# Patient Record
Sex: Female | Born: 1970 | Race: Asian | Hispanic: No | Marital: Married | Smoking: Never smoker
Health system: Southern US, Community
[De-identification: ages and names within clinical notes are randomized; demographics above are authoritative.]

## PROBLEM LIST (undated history)

## (undated) DIAGNOSIS — Z789 Other specified health status: Secondary | ICD-10-CM

## (undated) HISTORY — DX: Other specified health status: Z78.9

## (undated) HISTORY — PX: NO PAST SURGERIES: SHX2092

---

## 2010-06-13 ENCOUNTER — Ambulatory Visit: Payer: Self-pay | Admitting: Internal Medicine

## 2010-12-24 ENCOUNTER — Ambulatory Visit: Payer: Self-pay | Admitting: Internal Medicine

## 2011-06-05 ENCOUNTER — Ambulatory Visit: Payer: Self-pay | Admitting: Internal Medicine

## 2011-11-05 ENCOUNTER — Ambulatory Visit: Payer: Self-pay | Admitting: General Practice

## 2012-06-15 ENCOUNTER — Ambulatory Visit: Payer: Self-pay | Admitting: Internal Medicine

## 2015-02-15 ENCOUNTER — Other Ambulatory Visit: Payer: Self-pay | Admitting: Internal Medicine

## 2015-02-15 DIAGNOSIS — Z1231 Encounter for screening mammogram for malignant neoplasm of breast: Secondary | ICD-10-CM

## 2015-02-22 ENCOUNTER — Ambulatory Visit: Payer: Self-pay | Attending: Internal Medicine

## 2015-11-24 ENCOUNTER — Other Ambulatory Visit: Payer: Self-pay | Admitting: Internal Medicine

## 2015-11-24 DIAGNOSIS — M25561 Pain in right knee: Secondary | ICD-10-CM

## 2016-08-27 ENCOUNTER — Other Ambulatory Visit: Payer: Self-pay | Admitting: Internal Medicine

## 2016-08-27 DIAGNOSIS — Z1231 Encounter for screening mammogram for malignant neoplasm of breast: Secondary | ICD-10-CM

## 2016-08-29 ENCOUNTER — Ambulatory Visit
Admission: RE | Admit: 2016-08-29 | Discharge: 2016-08-29 | Disposition: A | Discharge status: Home / Self Care | Payer: 59 | Source: Ambulatory Visit | Attending: Internal Medicine | Admitting: Internal Medicine

## 2016-08-29 ENCOUNTER — Encounter: Payer: Self-pay | Admitting: Radiology

## 2016-08-29 DIAGNOSIS — Z1231 Encounter for screening mammogram for malignant neoplasm of breast: Secondary | ICD-10-CM | POA: Insufficient documentation

## 2018-02-16 ENCOUNTER — Ambulatory Visit (INDEPENDENT_AMBULATORY_CARE_PROVIDER_SITE_OTHER): Payer: Managed Care, Other (non HMO) | Admitting: Obstetrics and Gynecology

## 2018-02-16 ENCOUNTER — Encounter: Payer: Self-pay | Admitting: Obstetrics and Gynecology

## 2018-02-16 VITALS — BP 102/68 | HR 68 | Ht 63.0 in | Wt 155.0 lb

## 2018-02-16 DIAGNOSIS — Z1239 Encounter for other screening for malignant neoplasm of breast: Secondary | ICD-10-CM

## 2018-02-16 DIAGNOSIS — Z1231 Encounter for screening mammogram for malignant neoplasm of breast: Secondary | ICD-10-CM | POA: Diagnosis not present

## 2018-02-16 DIAGNOSIS — Z01419 Encounter for gynecological examination (general) (routine) without abnormal findings: Secondary | ICD-10-CM

## 2018-02-16 DIAGNOSIS — Z124 Encounter for screening for malignant neoplasm of cervix: Secondary | ICD-10-CM | POA: Diagnosis not present

## 2018-02-16 NOTE — Progress Notes (Signed)
Gynecology Annual Exam  PCP: Patient, No Pcp Per  Chief Complaint:  Chief Complaint  Patient presents with  . Gynecologic Exam    History of Present Illness: Patient is a 47 y.o. No obstetric history on file. presents for annual exam. The patient has no complaints today.   LMP: Patient's last menstrual period was 01/09/2018. No menses IUD   The patient is sexually active. She currently uses IUD for contraception. She denies dyspareunia.  The patient does perform self breast exams.  There is no notable family history of breast or ovarian cancer in her family.  The patient wears seatbelts: yes.   The patient has regular exercise: not asked.    The patient denies current symptoms of depression.    Review of Systems: Review of Systems  Constitutional: Negative for chills and fever.  HENT: Negative for congestion.   Respiratory: Negative for cough and shortness of breath.   Cardiovascular: Negative for chest pain and palpitations.  Gastrointestinal: Negative for abdominal pain, constipation, diarrhea, heartburn, nausea and vomiting.  Genitourinary: Negative for dysuria, frequency and urgency.  Skin: Negative for itching and rash.  Neurological: Negative for dizziness and headaches.  Endo/Heme/Allergies: Negative for polydipsia.  Psychiatric/Behavioral: Negative for depression.    Past Medical History:  History reviewed. No pertinent past medical history.  Past Surgical History:  History reviewed. No pertinent surgical history.  Gynecologic History:  Patient's last menstrual period was 01/09/2018. Contraception: 06/02/2014 IUD Mirena Last Pap: Results were: 08/15/2016 NIL and HR HPV negative  Last mammogram: 08/29/2016 Results were: BI-RAD I  Obstetric History: No obstetric history on file.  Family History:  Family History  Problem Relation Age of Onset  . Breast cancer Maternal Grandmother 80    Social History:  Social History   Socioeconomic History  .  Marital status: Married    Spouse name: Not on file  . Number of children: Not on file  . Years of education: Not on file  . Highest education level: Not on file  Occupational History  . Not on file  Social Needs  . Financial resource strain: Not on file  . Food insecurity:    Worry: Not on file    Inability: Not on file  . Transportation needs:    Medical: Not on file    Non-medical: Not on file  Tobacco Use  . Smoking status: Never Smoker  . Smokeless tobacco: Never Used  Substance and Sexual Activity  . Alcohol use: Never    Frequency: Never  . Drug use: Never  . Sexual activity: Yes    Partners: Male    Birth control/protection: IUD  Lifestyle  . Physical activity:    Days per week: Not on file    Minutes per session: Not on file  . Stress: Not on file  Relationships  . Social connections:    Talks on phone: Not on file    Gets together: Not on file    Attends religious service: Not on file    Active member of club or organization: Not on file    Attends meetings of clubs or organizations: Not on file    Relationship status: Not on file  . Intimate partner violence:    Fear of current or ex partner: Not on file    Emotionally abused: Not on file    Physically abused: Not on file    Forced sexual activity: Not on file  Other Topics Concern  . Not on file  Social History  Narrative  . Not on file    Allergies:  No Known Allergies  Medications: Prior to Admission medications   Not on File    Physical Exam Vitals: Blood pressure 102/68, pulse 68, height 5\' 3"  (1.6 m), weight 155 lb (70.3 kg), last menstrual period 01/09/2018.  General: NAD HEENT: normocephalic, anicteric Thyroid: no enlargement, no palpable nodules Pulmonary: No increased work of breathing, CTAB Cardiovascular: RRR, distal pulses 2+ Breast: Breast symmetrical, no tenderness, no palpable nodules or masses, no skin or nipple retraction present, no nipple discharge.  No axillary or  supraclavicular lymphadenopathy. Abdomen: NABS, soft, non-tender, non-distended.  Umbilicus without lesions.  No hepatomegaly, splenomegaly or masses palpable. No evidence of hernia  Genitourinary:  External: Normal external female genitalia.  Normal urethral meatus, normal Bartholin's and Skene's glands.    Vagina: Normal vaginal mucosa, no evidence of prolapse.    Cervix: Grossly normal in appearance, no bleeding, IUD string visualized 2cm  Uterus: Non-enlarged, mobile, normal contour.  No CMT  Adnexa: ovaries non-enlarged, no adnexal masses  Rectal: deferred  Lymphatic: no evidence of inguinal lymphadenopathy Extremities: no edema, erythema, or tenderness Neurologic: Grossly intact Psychiatric: mood appropriate, affect full  Female chaperone present for pelvic and breast  portions of the physical exam    Assessment: 47 y.o. No obstetric history on file. routine annual exam  Plan: Problem List Items Addressed This Visit    None    Visit Diagnoses    Breast screening    -  Primary   Relevant Orders   MM DIGITAL SCREENING BILATERAL   Screening for malignant neoplasm of cervix       Relevant Orders   PapIG, HPV, rfx 16/18   Encounter for gynecological examination without abnormal finding       Relevant Orders   PapIG, HPV, rfx 16/18      1) Mammogram - recommend yearly screening mammogram.  Mammogram Was ordered today   2) STI screening  was notoffered and therefore not obtained  3) ASCCP guidelines and rational discussed.  Patient opts for yearly screening interval  4) Contraception - the patient is currently using  IUD.  She is happy with her current form of contraception and plans to continue  5) Colonoscopy -- Screening recommended starting at age 45 for average risk individuals, age 45 for individuals deemed at increased risk (including African Americans) and recommended to continue until age 45.  For patient age 45-85 individualized approach is recommended.  Gold  standard screening is via colonoscopy, Cologuard screening is an acceptable alternative for patient unwilling or unable to undergo colonoscopy.  "Colorectal cancer screening for average?risk adults: 2018 guideline update from the American Cancer Society"CA: A Cancer Journal for Clinicians: Jan 29, 2017   6) Routine healthcare maintenance including cholesterol, diabetes screening discussed managed by PCP  7) No follow-ups on file.   Vena AustriaAndreas Tinisha Etzkorn, MD, Merlinda FrederickFACOG [facility], [facility] 02/16/2018, 8:58 AM

## 2018-02-16 NOTE — Patient Instructions (Signed)
[facility] [place] [address]  [facility]  [address] [place] [address]  Phone: [phone]   Preventive Care 18-39 Years, Female Preventive care refers to lifestyle choices and visits with your health care provider that can promote health and wellness. What does preventive care include?  A yearly physical exam. This is also called an annual well check.  Dental exams once or twice a year.  Routine eye exams. Ask your health care provider how often you should have your eyes checked.  Personal lifestyle choices, including: ? Daily care of your teeth and gums. ? Regular physical activity. ? Eating a healthy diet. ? Avoiding tobacco and drug use. ? Limiting alcohol use. ? Practicing safe sex. ? Taking vitamin and mineral supplements as recommended by your health care provider. What happens during an annual well check? The services and screenings done by your health care provider during your annual well check will depend on your age, overall health, lifestyle risk factors, and family history of disease. Counseling Your health care provider may ask you questions about your:  Alcohol use.  Tobacco use.  Drug use.  Emotional well-being.  Home and relationship well-being.  Sexual activity.  Eating habits.  Work and work Statistician.  Method of birth control.  Menstrual cycle.  Pregnancy history.  Screening You may have the following tests or measurements:  Height, weight, and BMI.  Diabetes screening. This is done by checking your blood sugar (glucose) after you have not eaten for a while (fasting).  Blood pressure.  Lipid and cholesterol levels. These may be checked every 5 years starting at age 85.  Skin check.  Hepatitis C blood test.  Hepatitis B blood test.  Sexually transmitted disease (STD) testing.  BRCA-related cancer screening. This may be done if you have a family history of breast,  ovarian, tubal, or peritoneal cancers.  Pelvic exam and Pap test. This may be done every 3 years starting at age 61. Starting at age 58, this may be done every 5 years if you have a Pap test in combination with an HPV test.  Discuss your test results, treatment options, and if necessary, the need for more tests with your health care provider. Vaccines Your health care provider may recommend certain vaccines, such as:  Influenza vaccine. This is recommended every year.  Tetanus, diphtheria, and acellular pertussis (Tdap, Td) vaccine. You may need a Td booster every 10 years.  Varicella vaccine. You may need this if you have not been vaccinated.  HPV vaccine. If you are 46 or younger, you may need three doses over 6 months.  Measles, mumps, and rubella (MMR) vaccine. You may need at least one dose of MMR. You may also need a second dose.  Pneumococcal 13-valent conjugate (PCV13) vaccine. You may need this if you have certain conditions and were not previously vaccinated.  Pneumococcal polysaccharide (PPSV23) vaccine. You may need one or two doses if you smoke cigarettes or if you have certain conditions.  Meningococcal vaccine. One dose is recommended if you are age 58-21 years and a first-year college student living in a residence hall, or if you have one of several medical conditions. You may also need additional booster doses.  Hepatitis A vaccine. You may need this if you have certain conditions or if you travel or work in places where you may be exposed to hepatitis A.  Hepatitis B vaccine. You may need this if you have certain conditions or  if you travel or work in places where you may be exposed to hepatitis B.  Haemophilus influenzae type b (Hib) vaccine. You may need this if you have certain risk factors.  Talk to your health care provider about which screenings and vaccines you need and how often you need them. This information is not intended to replace advice given to you by  your health care provider. Make sure you discuss any questions you have with your health care provider. Document Released: 10/15/2001 Document Revised: 05/08/2016 Document Reviewed: 06/20/2015 Elsevier Interactive Patient Education  [employer].

## 2018-02-19 LAB — PAPIG, HPV, RFX 16/18
HPV, high-risk: NEGATIVE
PAP Smear Comment: 0

## 2019-07-05 ENCOUNTER — Telehealth: Payer: Self-pay | Admitting: Obstetrics and Gynecology

## 2019-07-05 NOTE — Telephone Encounter (Signed)
Noted. Mirena reserved for this patient. 

## 2019-07-05 NOTE — Telephone Encounter (Signed)
Patient is schedule for annual and Mirena replacement on 07/19/19 with AMS

## 2019-07-19 ENCOUNTER — Encounter: Payer: Self-pay | Admitting: Obstetrics and Gynecology

## 2019-07-19 ENCOUNTER — Other Ambulatory Visit: Payer: Self-pay

## 2019-07-19 ENCOUNTER — Ambulatory Visit (INDEPENDENT_AMBULATORY_CARE_PROVIDER_SITE_OTHER): Payer: Managed Care, Other (non HMO) | Admitting: Obstetrics and Gynecology

## 2019-07-19 VITALS — BP 136/88 | Ht 62.0 in | Wt 176.0 lb

## 2019-07-19 DIAGNOSIS — Z1239 Encounter for other screening for malignant neoplasm of breast: Secondary | ICD-10-CM

## 2019-07-19 DIAGNOSIS — Z01419 Encounter for gynecological examination (general) (routine) without abnormal findings: Secondary | ICD-10-CM

## 2019-07-19 DIAGNOSIS — Z124 Encounter for screening for malignant neoplasm of cervix: Secondary | ICD-10-CM

## 2019-07-19 DIAGNOSIS — Z30431 Encounter for routine checking of intrauterine contraceptive device: Secondary | ICD-10-CM

## 2019-07-19 NOTE — Patient Instructions (Signed)
[facility] [address] [place] [address]  [facility]  [address] [place] [address]  Phone: [phone]  

## 2019-07-19 NOTE — Progress Notes (Signed)
Gynecology Annual Exam  PCP: Shelley Harper, No Pcp Per  Chief Complaint:  Chief Complaint  Shelley Harper presents with  . Procedure    IUD removal/Re-insert    History of Present Illness: Shelley Harper is a 48 y.o. G4P0020 presents for annual exam. The Shelley Harper has no complaints today.   LMP: No LMP recorded. (Menstrual status: IUD). Amenorrhea on Mirena IUD  The Shelley Harper is sexually active. She currently uses IUD for contraception. She denies dyspareunia.  The Shelley Harper does perform self breast exams.  There is no notable family history of breast or ovarian cancer in her family, only case of breast cancer paternal grandmother.  The Shelley Harper wears seatbelts: yes.   The Shelley Harper has regular exercise: not asked.    The Shelley Harper denies current symptoms of depression.    Review of Systems: Review of Systems  Constitutional: Negative for chills and fever.  HENT: Negative for congestion.   Respiratory: Negative for cough and shortness of breath.   Cardiovascular: Negative for chest pain and palpitations.  Gastrointestinal: Negative for abdominal pain, constipation, diarrhea, heartburn, nausea and vomiting.  Genitourinary: Negative for dysuria, frequency and urgency.  Skin: Negative for itching and rash.  Neurological: Negative for dizziness and headaches.  Endo/Heme/Allergies: Negative for polydipsia.  Psychiatric/Behavioral: Negative for depression.    Past Medical History:  Past Medical History:  Diagnosis Date  . No known health problems     Past Surgical History:  Past Surgical History:  Procedure Laterality Date  . NO PAST SURGERIES      Gynecologic History:  No LMP recorded. (Menstrual status: IUD). Contraception:06/02/2014 Mirena IUD Last Pap: Results were: 02/16/2018 NIL and HR HPV negative  08/15/2016 NIL and HR HPV negative  Last mammogram: 08/29/2016 Results were: BI-RAD I  Obstetric History: G4P0020  Family History:  Family History  Problem Relation Age of Onset  . Breast  cancer Maternal Grandmother 74    Social History:  Social History   Socioeconomic History  . Marital status: Married    Spouse name: Not on file  . Number of children: Not on file  . Years of education: Not on file  . Highest education level: Not on file  Occupational History  . Not on file  Social Needs  . Financial resource strain: Not on file  . Food insecurity    Worry: Not on file    Inability: Not on file  . Transportation needs    Medical: Not on file    Non-medical: Not on file  Tobacco Use  . Smoking status: Never Smoker  . Smokeless tobacco: Never Used  Substance and Sexual Activity  . Alcohol use: Never    Frequency: Never  . Drug use: Never  . Sexual activity: Yes    Partners: Male    Birth control/protection: I.U.D.  Lifestyle  . Physical activity    Days per week: Not on file    Minutes per session: Not on file  . Stress: Not on file  Relationships  . Social Herbalist on phone: Not on file    Gets together: Not on file    Attends religious service: Not on file    Active member of club or organization: Not on file    Attends meetings of clubs or organizations: Not on file    Relationship status: Not on file  . Intimate partner violence    Fear of current or ex partner: Not on file    Emotionally abused: Not on file  Physically abused: Not on file    Forced sexual activity: Not on file  Other Topics Concern  . Not on file  Social History Narrative  . Not on file    Allergies:  No Known Allergies  Medications: Prior to Admission medications   Medication Sig Start Date End Date Taking? Authorizing Provider  Cholecalciferol (VITAMIN D3) 1000 units CAPS Take by mouth. 07/23/16   [provider]    Physical Exam Vitals: Blood pressure 136/88, height 5\' 2"  (1.575 m), weight 176 lb (79.8 kg).  General: NAD HEENT: normocephalic, anicteric Thyroid: no enlargement, no palpable nodules Pulmonary: No increased work of  breathing, CTAB Cardiovascular: RRR, distal pulses 2+ Breast: Breast symmetrical, no tenderness, no palpable nodules or masses, no skin or nipple retraction present, no nipple discharge.  No axillary or supraclavicular lymphadenopathy. Abdomen: NABS, soft, non-tender, non-distended.  Umbilicus without lesions.  No hepatomegaly, splenomegaly or masses palpable. No evidence of hernia  Genitourinary:  External: Normal external female genitalia.  Normal urethral meatus, normal Bartholin's and Skene's glands.    Vagina: Normal vaginal mucosa, no evidence of prolapse.    Cervix: Grossly normal in appearance, no bleeding, IUD strings visualized  Uterus: Non-enlarged, mobile, normal contour.  No CMT  Adnexa: ovaries non-enlarged, no adnexal masses  Rectal: deferred  Lymphatic: no evidence of inguinal lymphadenopathy Extremities: no edema, erythema, or tenderness Neurologic: Grossly intact Psychiatric: mood appropriate, affect full  Female chaperone present for pelvic and breast  portions of the physical exam    Assessment: 48 y.o. G4P0020 routine annual exam  Plan: Problem List Items Addressed This Visit    None    Visit Diagnoses    Cervical cancer screening    -  Primary   Relevant Orders   PapIG, HPV, rfx 16/18   Encounter for gynecological examination without abnormal finding       Breast screening       Relevant Orders   MM 3D SCREEN BREAST BILATERAL   IUD check up          1) Mammogram - recommend yearly screening mammogram.  Mammogram Was ordered today   2) STI screening  was notoffered and therefore not obtained  3) ASCCP guidelines and rational discussed.  Shelley Harper opts for yearly screening interval  4) Contraception - the Shelley Harper is currently using  IUD.  She is happy with her current form of contraception and plans to continue - discussed FDA indication on Mirena is 5 years of use.  However, good data supporting use for up to 7 years out of [birth year]-22[birth year][facility]  Also 6 year indication on generic Mirena [place])  5) Colonoscopy -- age 40  6) Routine healthcare maintenance including cholesterol, diabetes screening discussed managed by PCP  7) Return in about 6 weeks (around 08/30/2019) for IUD string check.   09/01/2019, MD, Vena Austria [facility], [facility] 07/19/2019, 2:47 PM

## 2019-07-25 LAB — PAPIG, HPV, RFX 16/18: HPV, high-risk: NEGATIVE

## 2019-09-16 ENCOUNTER — Ambulatory Visit: Payer: Managed Care, Other (non HMO) | Attending: Internal Medicine

## 2019-09-16 DIAGNOSIS — Z20822 Contact with and (suspected) exposure to covid-19: Secondary | ICD-10-CM

## 2019-09-17 LAB — NOVEL CORONAVIRUS, NAA: SARS-CoV-2, NAA: NOT DETECTED

## 2020-01-12 DIAGNOSIS — R5383 Other fatigue: Secondary | ICD-10-CM | POA: Insufficient documentation

## 2020-01-12 DIAGNOSIS — D649 Anemia, unspecified: Secondary | ICD-10-CM | POA: Insufficient documentation

## 2020-08-02 ENCOUNTER — Ambulatory Visit: Payer: Managed Care, Other (non HMO) | Admitting: Obstetrics and Gynecology

## 2020-09-05 ENCOUNTER — Other Ambulatory Visit: Payer: Self-pay

## 2020-09-05 ENCOUNTER — Encounter: Payer: Self-pay | Admitting: Obstetrics and Gynecology

## 2020-09-05 ENCOUNTER — Ambulatory Visit (INDEPENDENT_AMBULATORY_CARE_PROVIDER_SITE_OTHER): Payer: Managed Care, Other (non HMO) | Admitting: Obstetrics and Gynecology

## 2020-09-05 VITALS — BP 128/82 | Ht 63.0 in | Wt 173.4 lb

## 2020-09-05 DIAGNOSIS — Z1211 Encounter for screening for malignant neoplasm of colon: Secondary | ICD-10-CM

## 2020-09-05 DIAGNOSIS — Z01419 Encounter for gynecological examination (general) (routine) without abnormal findings: Secondary | ICD-10-CM

## 2020-09-05 DIAGNOSIS — Z1239 Encounter for other screening for malignant neoplasm of breast: Secondary | ICD-10-CM | POA: Diagnosis not present

## 2020-09-05 NOTE — Progress Notes (Signed)
Gynecology Annual Exam  PCP: Shelley Harper, No Pcp Per  Chief Complaint:  Chief Complaint  Shelley Harper presents with  . Gynecologic Exam    Annual - no concerns. RM 5    History of Present Illness: Shelley Harper is a 50 y.o. G4P0020 presents for annual exam. The Shelley Harper has no complaints today.   LMP: No LMP recorded. (Menstrual status: IUD). Amenorrhea on Mirena IUD.  The Shelley Harper is sexually active. She currently uses IUD for contraception. She denies dyspareunia.  The Shelley Harper does perform self breast exams.  There is no notable family history of breast or ovarian cancer in her family.  The Shelley Harper wears seatbelts: yes.   The Shelley Harper has regular exercise: not asked.    The Shelley Harper denies current symptoms of depression.    Review of Systems: Review of Systems  Constitutional: Negative for chills and fever.  HENT: Negative for congestion.   Respiratory: Negative for cough and shortness of breath.   Cardiovascular: Negative for chest pain and palpitations.  Gastrointestinal: Negative for abdominal pain, constipation, diarrhea, heartburn, nausea and vomiting.  Genitourinary: Negative for dysuria, frequency and urgency.  Skin: Negative for itching and rash.  Neurological: Negative for dizziness and headaches.  Endo/Heme/Allergies: Negative for polydipsia.  Psychiatric/Behavioral: Negative for depression.    Past Medical History:  There are no problems to display for this Shelley Harper.   Past Surgical History:  Past Surgical History:  Procedure Laterality Date  . NO PAST SURGERIES      Gynecologic History:  No LMP recorded. (Menstrual status: IUD). Contraception: 06/02/2014 Mirena IUD Last Pap:  07/19/2019 NIL and HR HPV negative 02/16/2018 NIL and HR HPV negative  08/15/2016 NIL and HR HPV negative  Last mammogram: 2017 Results were: BI-RAD I  Obstetric History: G4P0020  Family History:  Family History  Problem Relation Age of Onset  . Breast cancer Maternal Grandmother 39     Social History:  Social History   Socioeconomic History  . Marital status: Married    Spouse name: Not on file  . Number of children: Not on file  . Years of education: Not on file  . Highest education level: Not on file  Occupational History  . Not on file  Tobacco Use  . Smoking status: Never Smoker  . Smokeless tobacco: Never Used  Substance and Sexual Activity  . Alcohol use: Never  . Drug use: Never  . Sexual activity: Yes    Partners: Male    Birth control/protection: I.U.D.  Other Topics Concern  . Not on file  Social History Narrative  . Not on file   Social Determinants of Health   Financial Resource Strain: Not on file  Food Insecurity: Not on file  Transportation Needs: Not on file  Physical Activity: Not on file  Stress: Not on file  Social Connections: Not on file  Intimate Partner Violence: Not on file    Allergies:  No Known Allergies  Medications: Prior to Admission medications   Medication Sig Start Date End Date Taking? Authorizing Provider  Cholecalciferol (VITAMIN D3) 1000 units CAPS Take by mouth. 07/23/16   [provider]  levonorgestrel (MIRENA) 20 MCG/24HR IUD 1 each by Intrauterine route once.    [provider]    Physical Exam Vitals: Blood pressure 128/82, height 5\' 3"  (1.6 m), weight 173 lb 6 oz (78.6 kg).  General: NAD HEENT: normocephalic, anicteric Thyroid: no enlargement, no palpable nodules Pulmonary: No increased work of breathing, CTAB Cardiovascular: RRR, distal pulses 2+ Breast: Breast  symmetrical, no tenderness, no palpable nodules or masses, no skin or nipple retraction present, no nipple discharge.  No axillary or supraclavicular lymphadenopathy. Abdomen: NABS, soft, non-tender, non-distended.  Umbilicus without lesions.  No hepatomegaly, splenomegaly or masses palpable. No evidence of hernia  Genitourinary:  External: Normal external female genitalia.  Normal urethral meatus, normal Bartholin's  and Skene's glands.    Vagina: Normal vaginal mucosa, no evidence of prolapse.    Cervix: Grossly normal in appearance, no bleeding, IUD strings visualized 3cm  Uterus: Non-enlarged, mobile, normal contour.  No CMT  Adnexa: ovaries non-enlarged, no adnexal masses  Rectal: deferred  Lymphatic: no evidence of inguinal lymphadenopathy Extremities: no edema, erythema, or tenderness Neurologic: Grossly intact Psychiatric: mood appropriate, affect full  Female chaperone present for pelvic and breast  portions of the physical exam   There is no immunization history on file for this Shelley Harper.   Assessment: 50 y.o. G4P0020 routine annual exam  Plan: Problem List Items Addressed This Visit   None   Visit Diagnoses    Encounter for gynecological examination without abnormal finding    -  Primary   Breast screening       Relevant Orders   MM 3D SCREEN BREAST BILATERAL   Colon cancer screening       Relevant Orders   Ambulatory referral to Gastroenterology      1) Mammogram - recommend yearly screening mammogram.  Mammogram Was ordered today   2) STI screening  was notoffered and therefore not obtained  3) ASCCP guidelines and rational discussed.  Shelley Harper opts for every 3 years screening interval  4) Contraception - the Shelley Harper is currently using  IUD.  She is happy with her current form of contraception and plans to continue  5) Colonoscopy -- Screening recommended starting at age 55 for average risk individuals   6) Routine healthcare maintenance including cholesterol, diabetes screening discussed managed by PCP  7) Return in about 1 year (around 09/05/2021) for annual and Mirena IUD replacement.   Vena Austria, MD, Evern Core [facility], [facility] 09/05/2020, 1:20 PM

## 2020-09-05 NOTE — Patient Instructions (Signed)
[facility] [address] [place] [address]  [facility] [place]  [address] [place]  [address]  Phone: [phone]  

## 2020-10-16 ENCOUNTER — Other Ambulatory Visit: Payer: Self-pay

## 2020-10-16 ENCOUNTER — Ambulatory Visit
Admission: RE | Admit: 2020-10-16 | Discharge: 2020-10-16 | Disposition: A | Discharge status: Home / Self Care | Payer: Managed Care, Other (non HMO) | Source: Ambulatory Visit | Attending: Obstetrics and Gynecology | Admitting: Obstetrics and Gynecology

## 2020-10-16 DIAGNOSIS — Z1231 Encounter for screening mammogram for malignant neoplasm of breast: Secondary | ICD-10-CM | POA: Insufficient documentation

## 2020-10-16 DIAGNOSIS — Z1239 Encounter for other screening for malignant neoplasm of breast: Secondary | ICD-10-CM

## 2021-04-16 IMAGING — MG MM DIGITAL SCREENING BILAT W/ TOMO AND CAD
6 of 10 series · 6 of 30 positions shown · non-contrast
Comparison: Previous exam(s).

CLINICAL DATA: Screening.

EXAM:
DIGITAL SCREENING BILATERAL MAMMOGRAM WITH TOMOSYNTHESIS AND CAD
TECHNIQUE: Bilateral screening digital craniocaudal and mediolateral oblique
mammograms were obtained. Bilateral screening digital breast
tomosynthesis was performed. The images were evaluated with
computer-aided detection.

[L CC synth-2D]
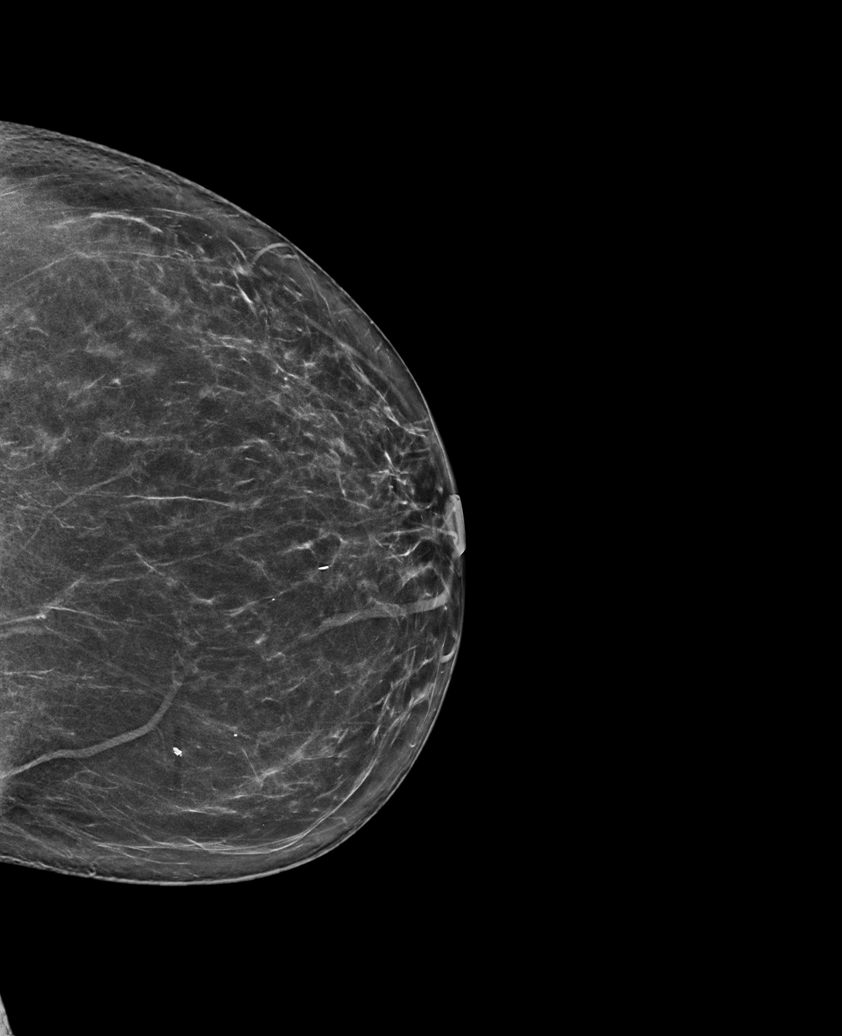

[L MLO synth-2D]
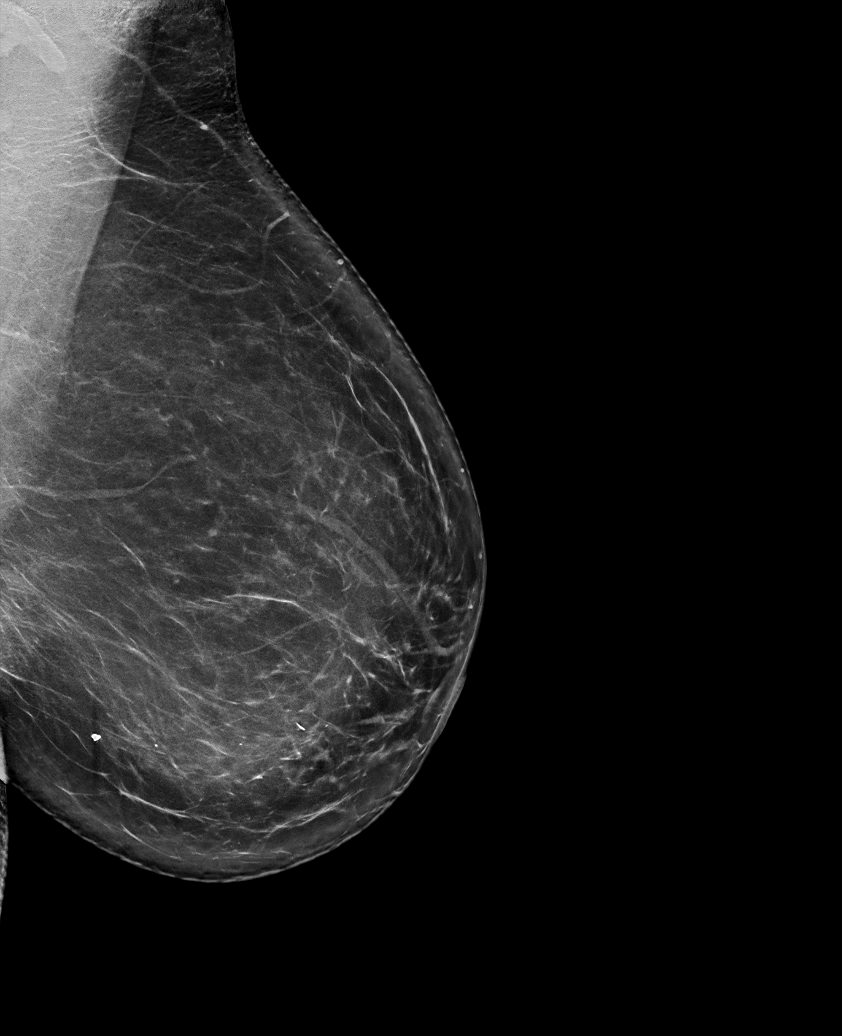

[R MLO synth-2D (1 of 2)]
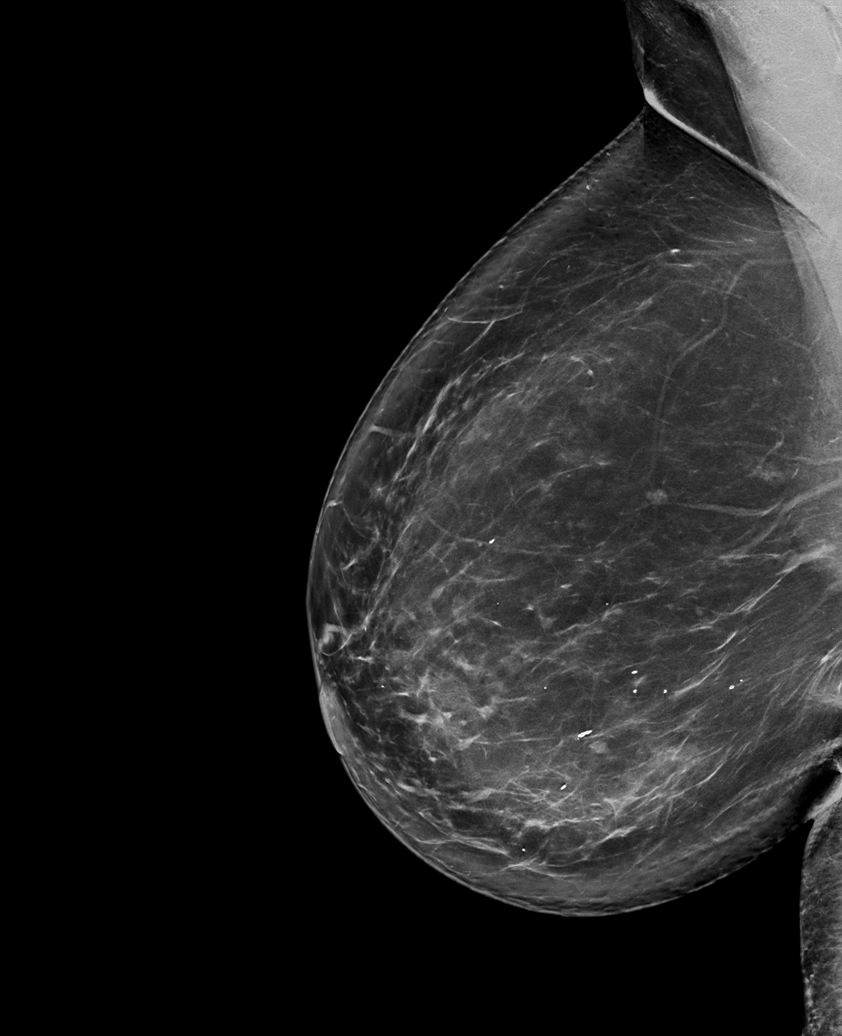

[R MLO synth-2D (2 of 2)]
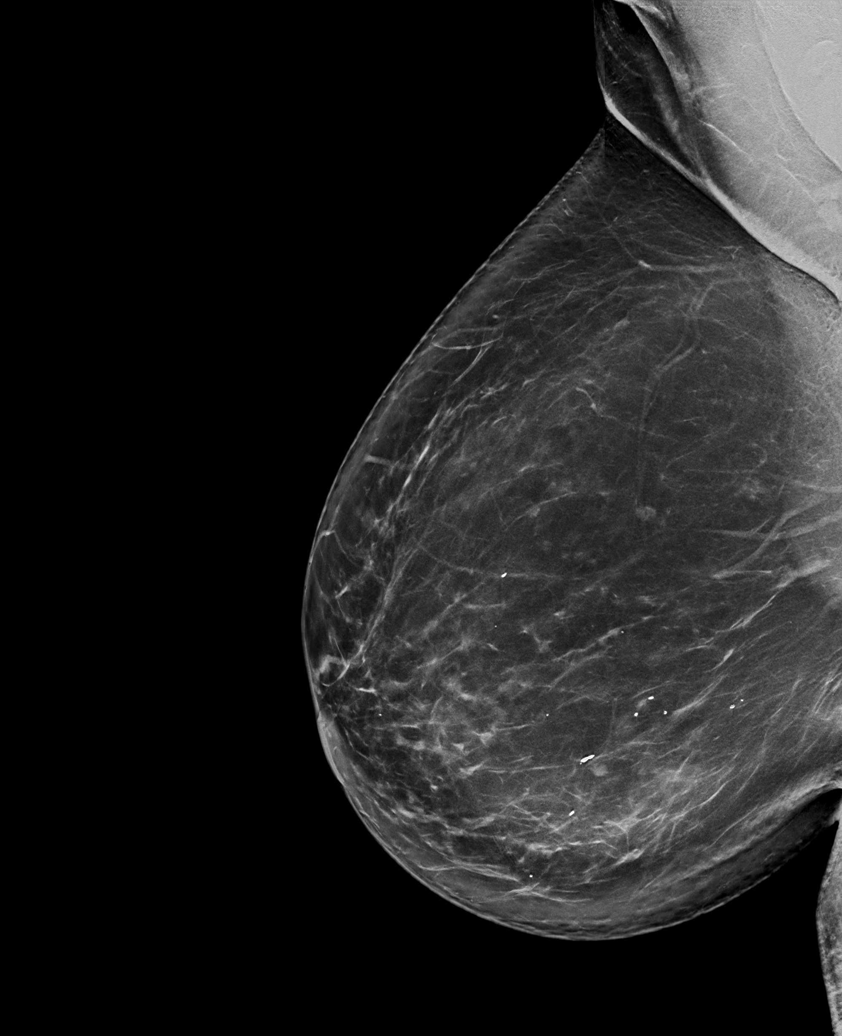

[R CC synth-2D]
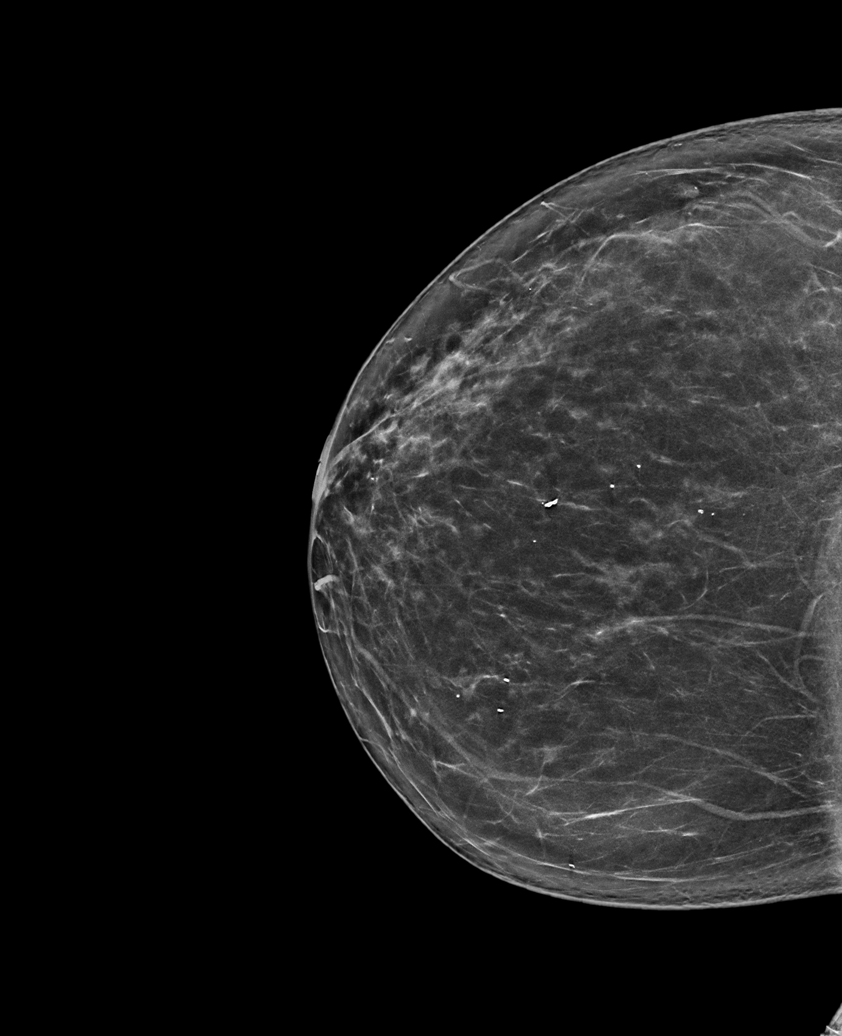

[R MLO tomo · tomo slice 44/87.0]
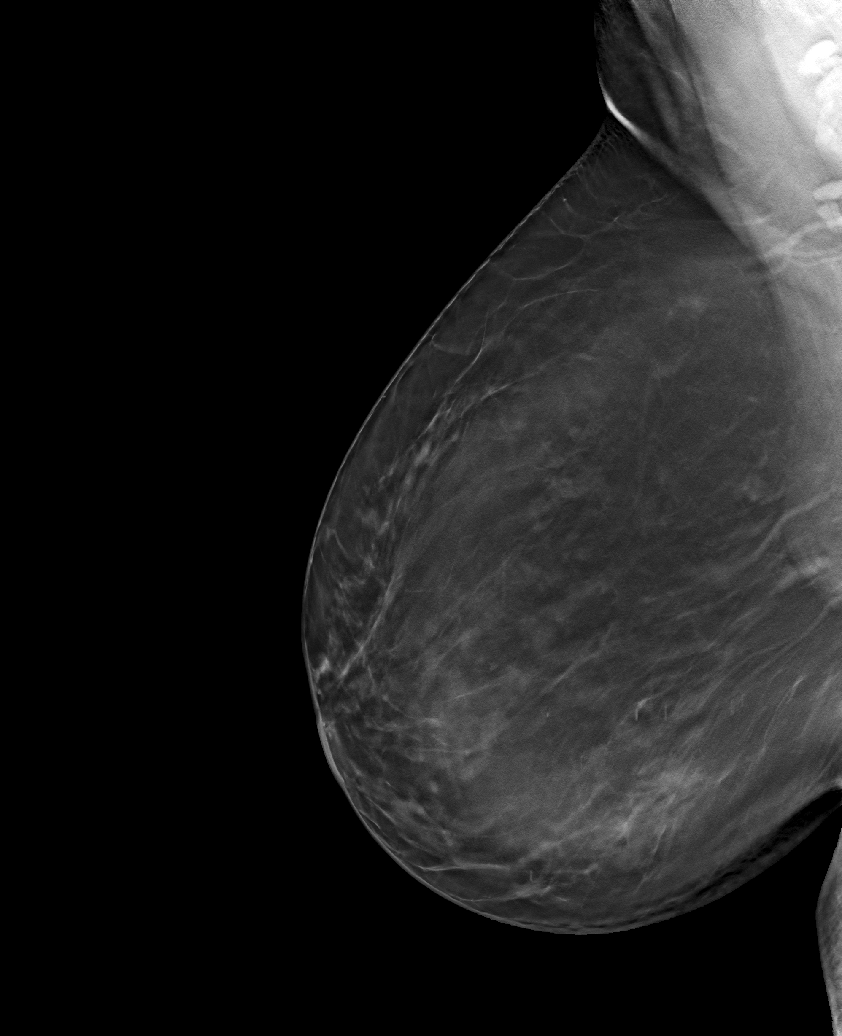

[6 of 30 positions shown; findings below may reference images not displayed]

ACR Breast Density Category b: There are scattered areas of
fibroglandular density.
FINDINGS: There are no findings suspicious for malignancy.
IMPRESSION: No mammographic evidence of malignancy. A result letter of this
screening mammogram will be mailed directly to the patient.

RECOMMENDATION:
Screening mammogram in one year. (Code:51-O-LD2)

BI-RADS CATEGORY  1: Negative.

## 2021-08-03 NOTE — Telephone Encounter (Signed)
NOT DUE for Exchange. Mirena not rcvd.

## 2021-09-10 ENCOUNTER — Ambulatory Visit: Payer: Managed Care, Other (non HMO) | Admitting: Obstetrics and Gynecology

## 2021-10-02 ENCOUNTER — Encounter: Payer: Self-pay | Admitting: Obstetrics and Gynecology

## 2021-10-02 ENCOUNTER — Ambulatory Visit (INDEPENDENT_AMBULATORY_CARE_PROVIDER_SITE_OTHER): Payer: Managed Care, Other (non HMO) | Admitting: Obstetrics and Gynecology

## 2021-10-02 ENCOUNTER — Other Ambulatory Visit: Payer: Self-pay

## 2021-10-02 VITALS — BP 128/70 | Ht 63.0 in | Wt 173.0 lb

## 2021-10-02 DIAGNOSIS — Z1231 Encounter for screening mammogram for malignant neoplasm of breast: Secondary | ICD-10-CM | POA: Diagnosis not present

## 2021-10-02 DIAGNOSIS — Z30431 Encounter for routine checking of intrauterine contraceptive device: Secondary | ICD-10-CM

## 2021-10-02 DIAGNOSIS — Z01419 Encounter for gynecological examination (general) (routine) without abnormal findings: Secondary | ICD-10-CM | POA: Diagnosis not present

## 2021-10-02 DIAGNOSIS — Z1211 Encounter for screening for malignant neoplasm of colon: Secondary | ICD-10-CM | POA: Diagnosis not present

## 2021-10-02 NOTE — Progress Notes (Signed)
PCP: Tracie Harrier, MD   Chief Complaint  Patient presents with   Gynecologic Exam    No concerns    HPI:      [name] is a 51 y.o. G4P0020 whose LMP was No LMP recorded. (Menstrual status: IUD)., presents today for her annual examination.  Her menses are absent with IUD, no BTB, no dysmen. She does have vasomotor sx.   Sex activity: single partner, contraception - IUD. Mirena placed 06/02/14. She does not have vaginal dryness.   Last Pap: 07/09/19  Results were: no abnormalities /neg HPV DNA.   Last mammogram: 10/16/20 Results were: normal--routine follow-up in 12 months There is a FH of breast cancer in her MGM, genetic testing not indicated. There is no FH of ovarian cancer. The patient does do self-breast exams.  Colonoscopy: never  Tobacco use: The patient denies current or previous tobacco use. Alcohol use: none No drug use Exercise: moderately active  She does get adequate calcium but not Vitamin D in her diet.  Labs with PCP.   There are no problems to display for this patient.   Past Surgical History:  Procedure Laterality Date   NO PAST SURGERIES      Family History  Problem Relation Age of Onset   Breast cancer Maternal Grandmother 54    Social History   Socioeconomic History   Marital status: Married    Spouse name: Not on file   Number of children: Not on file   Years of education: Not on file   Highest education level: Not on file  Occupational History   Not on file  Tobacco Use   Smoking status: Never   Smokeless tobacco: Never  Vaping Use   Vaping Use: Never used  Substance and Sexual Activity   Alcohol use: Never   Drug use: Never   Sexual activity: Yes    Partners: Male    Birth control/protection: I.U.D.    Comment: Mirena  Other Topics Concern   Not on file  Social History Narrative   Not on file   Social Determinants of Health   Financial Resource Strain: Not on file  Food Insecurity: Not on file   Transportation Needs: Not on file  Physical Activity: Not on file  Stress: Not on file  Social Connections: Not on file  Intimate Partner Violence: Not on file     Current Outpatient Medications:    levonorgestrel (MIRENA) 20 MCG/24HR IUD, 1 each by Intrauterine route once., Disp: , Rfl:      ROS:  Review of Systems  Constitutional:  Negative for fatigue, fever and unexpected weight change.  Respiratory:  Negative for cough, shortness of breath and wheezing.   Cardiovascular:  Negative for chest pain, palpitations and leg swelling.  Gastrointestinal:  Negative for blood in stool, constipation, diarrhea, nausea and vomiting.  Endocrine: Negative for cold intolerance, heat intolerance and polyuria.  Genitourinary:  Negative for dyspareunia, dysuria, flank pain, frequency, genital sores, hematuria, menstrual problem, pelvic pain, urgency, vaginal bleeding, vaginal discharge and vaginal pain.  Musculoskeletal:  Negative for back pain, joint swelling and myalgias.  Skin:  Negative for rash.  Neurological:  Negative for dizziness, syncope, light-headedness, numbness and headaches.  Hematological:  Negative for adenopathy.  Psychiatric/Behavioral:  Negative for agitation, confusion, sleep disturbance and suicidal ideas. The patient is not nervous/anxious.   BREAST: No symptoms    Objective: BP 128/70    Ht 5\' 3"  (1.6 m)    Wt 173 lb (78.5  kg)    BMI 30.65 kg/m    Physical Exam Constitutional:      Appearance: She is well-developed.  Genitourinary:     Vulva normal.     Right Labia: No rash, tenderness or lesions.    Left Labia: No tenderness, lesions or rash.    No vaginal discharge, erythema or tenderness.      Right Adnexa: not tender and no mass present.    Left Adnexa: not tender and no mass present.    No cervical friability or polyp.     Uterus is not enlarged or tender.  Breasts:    Right: No mass, nipple discharge, skin change or tenderness.     Left: No mass,  nipple discharge, skin change or tenderness.  Neck:     Thyroid: No thyromegaly.  Cardiovascular:     Rate and Rhythm: Normal rate and regular rhythm.     Heart sounds: Normal heart sounds. No murmur heard. Pulmonary:     Effort: Pulmonary effort is normal.     Breath sounds: Normal breath sounds.  Abdominal:     Palpations: Abdomen is soft.     Tenderness: There is no abdominal tenderness. There is no guarding or rebound.  Musculoskeletal:        General: Normal range of motion.     Cervical back: Normal range of motion.  Lymphadenopathy:     Cervical: No cervical adenopathy.  Neurological:     General: No focal deficit present.     Mental Status: She is alert and oriented to person, place, and time.     Cranial Nerves: No cranial nerve deficit.  Skin:    General: Skin is warm and dry.  Psychiatric:        Mood and Affect: Mood normal.        Behavior: Behavior normal.        Thought Content: Thought content normal.        Judgment: Judgment normal.  Vitals reviewed.    Assessment/Plan:  Encounter for annual routine gynecological examination  Encounter for routine checking of intrauterine contraceptive device (IUD); IUD due for removal 10/23. Discussed replacement vs other BC vs no BC and seeing what periods can do. Can check FSH/estradiol 1 mo after removal if no menses for menopause status.   Encounter for screening mammogram for malignant neoplasm of breast - Plan: MM 3D SCREEN BREAST BILATERAL, pt to [facility]  Screening for colon cancer--colonoscopy/cologuard discussed. Pt will think about it and f/u for ref prn.           GYN counsel breast self exam, mammography screening, menopause, adequate intake of calcium and vitamin D, diet and exercise    F/U  Return in about 1 year (around 10/02/2022).  Shelvy Heckert B. Stony Stegmann, PA-C 10/02/2021 3:39 PM

## 2021-10-02 NOTE — Patient Instructions (Addendum)
I value your feedback and you entrusting us with your care. If you get a [facility] patient survey, I would appreciate you taking the time to let us know about your experience today. Thank you!  [facility]: [phone]      

## 2021-12-12 ENCOUNTER — Ambulatory Visit
Admission: RE | Admit: 2021-12-12 | Discharge: 2021-12-12 | Disposition: A | Discharge status: Home / Self Care | Payer: Managed Care, Other (non HMO) | Source: Ambulatory Visit | Attending: Obstetrics and Gynecology | Admitting: Obstetrics and Gynecology

## 2021-12-12 DIAGNOSIS — Z1231 Encounter for screening mammogram for malignant neoplasm of breast: Secondary | ICD-10-CM | POA: Insufficient documentation

## 2022-06-10 ENCOUNTER — Emergency Department
Admission: EM | Admit: 2022-06-10 | Discharge: 2022-06-11 | Disposition: A | Discharge status: Other Acute Inpatient Hospital | Payer: Managed Care, Other (non HMO) | Attending: Emergency Medicine | Admitting: Emergency Medicine

## 2022-06-10 ENCOUNTER — Emergency Department: Payer: Managed Care, Other (non HMO)

## 2022-06-10 ENCOUNTER — Other Ambulatory Visit: Payer: Self-pay

## 2022-06-10 DIAGNOSIS — R55 Syncope and collapse: Secondary | ICD-10-CM | POA: Insufficient documentation

## 2022-06-10 DIAGNOSIS — I629 Nontraumatic intracranial hemorrhage, unspecified: Secondary | ICD-10-CM | POA: Insufficient documentation

## 2022-06-10 DIAGNOSIS — R1110 Vomiting, unspecified: Secondary | ICD-10-CM | POA: Insufficient documentation

## 2022-06-10 DIAGNOSIS — Z20822 Contact with and (suspected) exposure to covid-19: Secondary | ICD-10-CM | POA: Diagnosis not present

## 2022-06-10 DIAGNOSIS — R519 Headache, unspecified: Secondary | ICD-10-CM | POA: Diagnosis present

## 2022-06-10 LAB — COMPREHENSIVE METABOLIC PANEL
ALT: 18 U/L (ref 0–44)
AST: 23 U/L (ref 15–41)
Albumin: 4.8 g/dL (ref 3.5–5.0)
Alkaline Phosphatase: 62 U/L (ref 38–126)
Anion gap: 12 (ref 5–15)
BUN: 16 mg/dL (ref 6–20)
CO2: 22 mmol/L (ref 22–32)
Calcium: 9.3 mg/dL (ref 8.9–10.3)
Chloride: 104 mmol/L (ref 98–111)
Creatinine, Ser: 0.65 mg/dL (ref 0.44–1.00)
GFR, Estimated: >60 mL/min (ref >60)
Glucose, Bld: 153 mg/dL — ABNORMAL HIGH (ref 70–99)
Potassium: 3.7 mmol/L (ref 3.5–5.1)
Sodium: 138 mmol/L (ref 135–145)
Total Bilirubin: 0.7 mg/dL (ref 0.3–1.2)
Total Protein: 8.3 g/dL — ABNORMAL HIGH (ref 6.5–8.1)

## 2022-06-10 LAB — CBC WITH DIFFERENTIAL/PLATELET
Abs Immature Granulocytes: 0.05 10*3/uL (ref 0.00–0.07)
Basophils Absolute: 0 10*3/uL (ref 0.0–0.1)
Basophils Relative: 0 %
Eosinophils Absolute: 0 10*3/uL (ref 0.0–0.5)
Eosinophils Relative: 0 %
HCT: 37.4 % (ref 36.0–46.0)
Hemoglobin: 12 g/dL (ref 12.0–15.0)
Immature Granulocytes: 0 %
Lymphocytes Relative: 14 %
Lymphs Abs: 1.9 10*3/uL (ref 0.7–4.0)
MCH: 27.8 pg (ref 26.0–34.0)
MCHC: 32.1 g/dL (ref 30.0–36.0)
MCV: 86.8 fL (ref 80.0–100.0)
Monocytes Absolute: 0.4 10*3/uL (ref 0.1–1.0)
Monocytes Relative: 3 %
Neutro Abs: 10.9 10*3/uL — ABNORMAL HIGH (ref 1.7–7.7)
Neutrophils Relative %: 83 %
Platelets: 326 10*3/uL (ref 150–400)
RBC: 4.31 MIL/uL (ref 3.87–5.11)
RDW: 13.2 % (ref 11.5–15.5)
WBC: 13.3 10*3/uL — ABNORMAL HIGH (ref 4.0–10.5)
nRBC: 0 % (ref 0.0–0.2)

## 2022-06-10 LAB — SARS CORONAVIRUS 2 BY RT PCR: SARS Coronavirus 2 by RT PCR: NEGATIVE

## 2022-06-10 MED ORDER — NICARDIPINE HCL IN NACL 20-0.86 MG/200ML-% IV SOLN
3.0000 mg/h | INTRAVENOUS | Status: DC
  Administered 2022-06-10 – 2022-06-11 (×2): 5 mg/h via INTRAVENOUS
  Filled 2022-06-10 (×2): qty 200

## 2022-06-10 MED ORDER — LEVETIRACETAM IN NACL 1000 MG/100ML IV SOLN
1000.0000 mg | Freq: Once | INTRAVENOUS | Status: AC
  Administered 2022-06-10: 1000 mg via INTRAVENOUS
  Filled 2022-06-10: qty 100

## 2022-06-10 MED ORDER — IOHEXOL 350 MG/ML SOLN
75.0000 mL | Freq: Once | INTRAVENOUS | Status: AC | PRN
  Administered 2022-06-10: 75 mL via INTRAVENOUS

## 2022-06-10 MED ORDER — ACETAMINOPHEN 500 MG PO TABS
1000.0000 mg | ORAL_TABLET | Freq: Once | ORAL | Status: AC
  Administered 2022-06-10: 1000 mg via ORAL
  Filled 2022-06-10: qty 2

## 2022-06-10 NOTE — ED Provider Triage Note (Signed)
  Emergency Medicine Provider Triage Evaluation Note  Shelley Harper , a 51 y.o.female,  was evaluated in triage.  Pt complains of headache and syncope.  Patient states that she was at the gym when she passed out, causing her to fall forward and hit her head/face on a bar.  She states that since then, she has had persistent headache, fatigue, vomiting, and weakness.  Prior to this, she states that she has been feeling "sick lately"   Review of Systems  Positive: Syncope, headache, Negative: Denies fever, chest pain, vomiting  Physical Exam   Vitals:   06/10/22 2132  BP: (!) 190/92  Pulse: (!) 59  Resp: (!) 22  Temp: 98.1 F (36.7 C)  SpO2: 98%   Gen:   Awake, appears lethargic. Resp:  Normal effort  MSK:   Moves extremities without difficulty  Other:    Medical Decision Making  Given the patient's initial medical screening exam, the following diagnostic evaluation has been ordered. The patient will be placed in the appropriate treatment space, once one is available, to complete the evaluation and treatment. I have discussed the plan of care with the patient and I have advised the patient that an ED physician or mid-level practitioner will reevaluate their condition after the test results have been received, as the results may give them additional insight into the type of treatment they may need.    Diagnostics: Head/neck/maxillofacial CT, labs, UA, EKG, CXR  Treatments: none immediately   Teodoro Spray, [place] 06/10/22 2136

## 2022-06-10 NOTE — ED Provider Notes (Signed)
[facility] Provider Note    Event Date/Time   First MD Initiated Contact with Patient 06/10/22 2211     (approximate)   History   Headache, Emesis, and Loss of Consciousness   HPI  Shelley Harper is a 51 y.o. female here with severe headache.  Patient reportedly has had headaches with exercise for the last 2 days.  She was exercising at the gym today when she developed acute, severe headache followed by syncope.  She fell and landed on her head.  She has since had severe pain in her head, as well as feeling drowsy.  She obtained a CT in the waiting room which showed large intraparenchymal hematoma and she was sent back merely for evaluation.  She states she has a severe headache, has had intermittent spotty vision but this is not persistent.  Denies any focal numbness weakness.  No history of bleeds.  No family history of aneurysms or early strokes.  No history of hypertension.     Physical Exam   Triage Vital Signs: ED Triage Vitals  Enc Vitals Group     BP 06/10/22 2132 (!) 190/92     Pulse Rate 06/10/22 2132 (!) 59     Resp 06/10/22 2132 (!) 22     Temp 06/10/22 2132 98.1 F (36.7 C)     Temp Source 06/10/22 2132 Oral     SpO2 06/10/22 2132 98 %     Weight 06/10/22 2133 160 lb (72.6 kg)     Height 06/10/22 2133 5\' 3"  (1.6 m)     Head Circumference --      Peak Flow --      Pain Score 06/10/22 2133 9     Pain Loc --      Pain Edu? --      Excl. in GC? --     Most recent vital signs: Vitals:   06/11/22 0115 06/11/22 0127  BP: 110/69 109/64  Pulse: 64 69  Resp: 16 17  Temp:  98.5 F (36.9 C)  SpO2: 99% 99%     General: Awake, no distress.  CV:  Good peripheral perfusion.  Regular rate and rhythm. Resp:  Normal effort.  Lungs clear. Abd:  No distention.  No tenderness. Other:  Cranial nerves II through XII intact.  Strength out of 5 bilateral upper and lower extremities.  Normal sensation to light touch.  Oriented,  complaining of headache but able to answer questions appropriately.  Vision is grossly intact.   ED Results / Procedures / Treatments   Labs (all labs ordered are listed, but only abnormal results are displayed) Labs Reviewed  CBC WITH DIFFERENTIAL/PLATELET - Abnormal; Notable for the following components:      Result Value   WBC 13.3 (*)    Neutro Abs 10.9 (*)    All other components within normal limits  COMPREHENSIVE METABOLIC PANEL - Abnormal; Notable for the following components:   Glucose, Bld 153 (*)    Total Protein 8.3 (*)    All other components within normal limits  SARS CORONAVIRUS 2 BY RT PCR  PROTIME-INR  URINALYSIS, ROUTINE W REFLEX MICROSCOPIC  POC URINE PREG, ED     EKG Normal sinus rhythm with sinus arrhythmia, ventricular rate 66.  PR 120, QRS 74, QTc 429.  No acute ST elevations or depressions.  No acute evidence of acute ischemia or infarct.   RADIOLOGY CT head: Acute intraparenchymal hematoma within the right occipital lobe with small volume surrounding  cytotoxic edema and small volume subarachnoid, no midline shift.   I also independently reviewed and agree with radiologist interpretations.   PROCEDURES:  Critical Care performed: Yes, see critical care procedure note(s)  .Critical Care  Performed by: Shaune Pollack, MD Authorized by: Shaune Pollack, MD   Critical care provider statement:    Critical care time (minutes):  30   Critical care time was exclusive of:  Separately billable procedures and treating other patients   Critical care was necessary to treat or prevent imminent or life-threatening deterioration of the following conditions:  Cardiac failure, circulatory failure, CNS failure or compromise and respiratory failure   Critical care was time spent personally by me on the following activities:  Development of treatment plan with patient or surrogate, discussions with consultants, evaluation of patient's response to treatment,  examination of patient, ordering and review of laboratory studies, ordering and review of radiographic studies, ordering and performing treatments and interventions, pulse oximetry, re-evaluation of patient's condition and review of old charts     MEDICATIONS ORDERED IN ED: Medications  nicardipine (CARDENE) 20mg  in 0.86% saline IV infusion (0.1 mg/ml) (5 mg/hr Intravenous New Bag/Given 06/11/22 0112)  levETIRAcetam (KEPPRA) IVPB 1000 mg/100 mL premix (0 mg Intravenous Stopped 06/10/22 2300)  acetaminophen (TYLENOL) tablet 1,000 mg (1,000 mg Oral Given 06/10/22 2323)  iohexol (OMNIPAQUE) 350 MG/ML injection 75 mL (75 mLs Intravenous Contrast Given 06/10/22 2319)  morphine (PF) 4 MG/ML injection 4 mg (4 mg Intravenous Given 06/11/22 0037)  ondansetron (ZOFRAN) injection 4 mg (4 mg Intravenous Given 06/11/22 0037)     IMPRESSION / MDM / ASSESSMENT AND PLAN / ED COURSE  I reviewed the triage vital signs and the nursing notes.                               The patient is on the cardiac monitor to evaluate for evidence of arrhythmia and/or significant heart rate changes.   Ddx:  Differential includes the following, with pertinent life- or limb-threatening emergencies considered:  Hemorrhagic stroke, intracranial mass/lesion with hemorrhage, AVM, less likely traumatic bleed  Patient's presentation is most consistent with acute presentation with potential threat to life or bodily function.  MDM:  51 year old otherwise healthy female here with acute, severe headache.  CT scan obtained shows large intraparenchymal hemorrhage with some adjacent subarachnoid hemorrhage.  Clinically, suspect possible acute hemorrhagic stroke versus intracranial mass/lesion with hemorrhagic conversion.  I suspect this is more likely than traumatic hemorrhage based on imaging and story.  Discussed case with Dr. 54 of neurosurgery, as well as neurology at [facility].  Patient will be sent for CT angio as well  as CT venogram.  Patient is not on anticoagulation.  IV Cardene started for goal blood pressure 1 30-1 50.  CT Angio and Venogram largely unremarkable. Bleed appears unchanged. Pt c/o headache but remains AOx4. BP at goal on cardene. Discussed findings with Neurology Dr. [facility], who recommends admission to [facility]. Since no ICU beds available, will transfer ED to ED for ongoing management by Neuro team at [facility]. Pt and husband updated and in agreement with this plan.   MEDICATIONS GIVEN IN ED: Medications  nicardipine (CARDENE) 20mg  in 0.86% saline [facility] IV infusion (0.1 mg/ml) (5 mg/hr Intravenous New Bag/Given 06/11/22 0112)  levETIRAcetam (KEPPRA) IVPB 1000 mg/100 mL premix (0 mg Intravenous Stopped 06/10/22 2300)  acetaminophen (TYLENOL) tablet 1,000 mg (1,000 mg Oral Given 06/10/22 2323)  iohexol (OMNIPAQUE) 350  MG/ML injection 75 mL (75 mLs Intravenous Contrast Given 06/10/22 2319)  morphine (PF) 4 MG/ML injection 4 mg (4 mg Intravenous Given 06/11/22 0037)  ondansetron (ZOFRAN) injection 4 mg (4 mg Intravenous Given 06/11/22 0037)     Consults:  Dr. Cari Caraway, Neurosurgery Dr. Lorrin Goodell, Neurology at [facility] reviewed       FINAL CLINICAL IMPRESSION(S) / ED DIAGNOSES   Final diagnoses:  Intracranial hemorrhage ([place])     Rx / DC Orders   ED Discharge Orders     None        Note:  This document was prepared using Dragon voice recognition software and may include unintentional dictation errors.   Duffy Bruce, MD 06/11/22 [phone]

## 2022-06-10 NOTE — ED Triage Notes (Addendum)
Pt presents to ED via POV c/o severe headache and vomiting. Pt states she was at gym at around 5:30 and blacked out then fell and hit her head. Denies CP, SOB. Pt states she feels like something is sharp stabbing pain her head. Pt not on blood thinners

## 2022-06-10 NOTE — Plan of Care (Signed)
Briefly, [name] is a 51 y.o. female who presents with sudden onset severe headache and vomiting starting at 1730. She blacked out and hit her head.  CT Head demonstrates a R occipital IPH with small SAH. CT Angio head negative for AVM. CT venogram negative for CVST. She is not on any anticoagulation.  Etiology of her ICH is unclear on imaging. Will need further workup.  Recs: - Transfer to [facility] ED to ED. She will need to be admitted to ICU under neurology. - Stability scan in 6 hours or STAT with any neurological decline - Frequent neuro checks; q69min for 1 hour, then q1hour - No antiplatelets or anticoagulants due to [place] - SCD for DVT prophylaxis, pharmacological DVT ppx at 24 hours if ICH is stable - Blood pressure control with goal systolic 203 - 559, cleverplex and labetalol PRN - Stroke labs, HgbA1c, fasting lipid panel - MRI brain with and without contrast when stabilized to evaluate for underlying mass - Risk factor modification - Echocardiogram - PT consult, OT consult, Speech consult.  [facility] Pager Number [phone]

## 2022-06-11 ENCOUNTER — Inpatient Hospital Stay (HOSPITAL_COMMUNITY): Payer: Managed Care, Other (non HMO)

## 2022-06-11 ENCOUNTER — Inpatient Hospital Stay (HOSPITAL_COMMUNITY)
Admission: EM | Admit: 2022-06-11 | Discharge: 2022-06-15 | DRG: 064 | Disposition: A | Discharge status: Home / Self Care | Payer: Managed Care, Other (non HMO) | Attending: Neurology | Admitting: Neurology

## 2022-06-11 ENCOUNTER — Encounter (HOSPITAL_COMMUNITY): Payer: Self-pay

## 2022-06-11 DIAGNOSIS — Z975 Presence of (intrauterine) contraceptive device: Secondary | ICD-10-CM | POA: Diagnosis not present

## 2022-06-11 DIAGNOSIS — I609 Nontraumatic subarachnoid hemorrhage, unspecified: Secondary | ICD-10-CM | POA: Diagnosis present

## 2022-06-11 DIAGNOSIS — G936 Cerebral edema: Secondary | ICD-10-CM | POA: Diagnosis present

## 2022-06-11 DIAGNOSIS — Z803 Family history of malignant neoplasm of breast: Secondary | ICD-10-CM

## 2022-06-11 DIAGNOSIS — R7303 Prediabetes: Secondary | ICD-10-CM | POA: Diagnosis present

## 2022-06-11 DIAGNOSIS — G4453 Primary thunderclap headache: Secondary | ICD-10-CM | POA: Diagnosis present

## 2022-06-11 DIAGNOSIS — Z823 Family history of stroke: Secondary | ICD-10-CM | POA: Diagnosis not present

## 2022-06-11 DIAGNOSIS — I6389 Other cerebral infarction: Secondary | ICD-10-CM

## 2022-06-11 DIAGNOSIS — I67841 Reversible cerebrovascular vasoconstriction syndrome: Secondary | ICD-10-CM | POA: Diagnosis not present

## 2022-06-11 DIAGNOSIS — I10 Essential (primary) hypertension: Secondary | ICD-10-CM | POA: Diagnosis present

## 2022-06-11 DIAGNOSIS — I610 Nontraumatic intracerebral hemorrhage in hemisphere, subcortical: Secondary | ICD-10-CM | POA: Diagnosis not present

## 2022-06-11 DIAGNOSIS — I611 Nontraumatic intracerebral hemorrhage in hemisphere, cortical: Secondary | ICD-10-CM

## 2022-06-11 DIAGNOSIS — R29701 NIHSS score 1: Secondary | ICD-10-CM | POA: Diagnosis present

## 2022-06-11 DIAGNOSIS — I619 Nontraumatic intracerebral hemorrhage, unspecified: Principal | ICD-10-CM | POA: Diagnosis present

## 2022-06-11 DIAGNOSIS — I629 Nontraumatic intracranial hemorrhage, unspecified: Secondary | ICD-10-CM | POA: Diagnosis not present

## 2022-06-11 LAB — RAPID URINE DRUG SCREEN, HOSP PERFORMED
Amphetamines: NOT DETECTED
Barbiturates: NOT DETECTED
Benzodiazepines: NOT DETECTED
Cocaine: NOT DETECTED
Opiates: POSITIVE — AB
Tetrahydrocannabinol: NOT DETECTED

## 2022-06-11 LAB — MRSA NEXT GEN BY PCR, NASAL: MRSA by PCR Next Gen: NOT DETECTED

## 2022-06-11 LAB — HIV ANTIBODY (ROUTINE TESTING W REFLEX): HIV Screen 4th Generation wRfx: NONREACTIVE

## 2022-06-11 LAB — PROTIME-INR
INR: 1.1 (ref 0.8–1.2)
Prothrombin Time: 13.9 seconds (ref 11.4–15.2)

## 2022-06-11 LAB — ECHOCARDIOGRAM COMPLETE
Area-P 1/2: 3.89 cm2
Height: 63 in
S' Lateral: 2.7 cm
Weight: 2560.02 oz

## 2022-06-11 MED ORDER — HYDRALAZINE HCL 20 MG/ML IJ SOLN: 20.0000 mg | Freq: Four times a day (QID) | INTRAMUSCULAR | Status: DC

## 2022-06-11 MED ORDER — SODIUM CHLORIDE 0.9 % IV BOLUS
500.0000 mL | Freq: Once | INTRAVENOUS | Status: AC
  Administered 2022-06-11: 500 mL via INTRAVENOUS

## 2022-06-11 MED ORDER — MORPHINE SULFATE (PF) 4 MG/ML IV SOLN
4.0000 mg | Freq: Once | INTRAVENOUS | Status: AC
  Administered 2022-06-11: 4 mg via INTRAVENOUS
  Filled 2022-06-11: qty 1

## 2022-06-11 MED ORDER — METOCLOPRAMIDE HCL 5 MG/ML IJ SOLN
10.0000 mg | Freq: Once | INTRAMUSCULAR | Status: AC
  Administered 2022-06-11: 10 mg via INTRAVENOUS
  Filled 2022-06-11: qty 2

## 2022-06-11 MED ORDER — ONDANSETRON HCL 4 MG PO TABS: 4.0000 mg | ORAL_TABLET | Freq: Three times a day (TID) | ORAL | Status: DC | PRN

## 2022-06-11 MED ORDER — ACETAMINOPHEN 325 MG PO TABS
650.0000 mg | ORAL_TABLET | ORAL | Status: DC | PRN
  Administered 2022-06-11 – 2022-06-15 (×7): 650 mg via ORAL
  Filled 2022-06-11 (×7): qty 2

## 2022-06-11 MED ORDER — ONDANSETRON HCL 4 MG/2ML IJ SOLN
4.0000 mg | Freq: Three times a day (TID) | INTRAMUSCULAR | Status: DC | PRN
  Administered 2022-06-11 – 2022-06-13 (×4): 4 mg via INTRAVENOUS
  Filled 2022-06-11 (×4): qty 2

## 2022-06-11 MED ORDER — GADOPICLENOL 0.5 MMOL/ML IV SOLN
7.3000 mL | Freq: Once | INTRAVENOUS | Status: AC | PRN
  Administered 2022-06-11: 7.3 mL via INTRAVENOUS

## 2022-06-11 MED ORDER — CHLORHEXIDINE GLUCONATE CLOTH 2 % EX PADS
6.0000 | MEDICATED_PAD | Freq: Every day | CUTANEOUS | Status: DC
  Administered 2022-06-11 – 2022-06-13 (×2): 6 via TOPICAL

## 2022-06-11 MED ORDER — HYDROMORPHONE HCL 1 MG/ML IJ SOLN
1.0000 mg | INTRAMUSCULAR | Status: DC | PRN
  Administered 2022-06-11 – 2022-06-13 (×8): 1 mg via INTRAVENOUS
  Filled 2022-06-11 (×9): qty 1

## 2022-06-11 MED ORDER — HYDRALAZINE HCL 20 MG/ML IJ SOLN
20.0000 mg | Freq: Four times a day (QID) | INTRAMUSCULAR | Status: DC | PRN
  Administered 2022-06-12 – 2022-06-13 (×2): 20 mg via INTRAVENOUS
  Filled 2022-06-11 (×2): qty 1

## 2022-06-11 MED ORDER — ACETAMINOPHEN 650 MG RE SUPP: 650.0000 mg | RECTAL | Status: DC | PRN

## 2022-06-11 MED ORDER — ONDANSETRON HCL 4 MG/2ML IJ SOLN
4.0000 mg | Freq: Once | INTRAMUSCULAR | Status: AC
  Administered 2022-06-11: 4 mg via INTRAVENOUS
  Filled 2022-06-11: qty 2

## 2022-06-11 MED ORDER — NICARDIPINE HCL IN NACL 20-0.86 MG/200ML-% IV SOLN
0.0000 mg/h | INTRAVENOUS | Status: DC
  Administered 2022-06-11: 5 mg/h via INTRAVENOUS

## 2022-06-11 MED ORDER — STROKE: EARLY STAGES OF RECOVERY BOOK: Freq: Once | Status: AC

## 2022-06-11 MED ORDER — SENNOSIDES-DOCUSATE SODIUM 8.6-50 MG PO TABS
1.0000 | ORAL_TABLET | Freq: Two times a day (BID) | ORAL | Status: DC
  Administered 2022-06-12 – 2022-06-14 (×5): 1 via ORAL
  Filled 2022-06-11 (×6): qty 1

## 2022-06-11 MED ORDER — ACETAMINOPHEN 325 MG PO TABS: 650.0000 mg | ORAL_TABLET | Freq: Four times a day (QID) | ORAL | Status: DC | PRN

## 2022-06-11 MED ORDER — PANTOPRAZOLE SODIUM 40 MG IV SOLR: 40.0000 mg | Freq: Every day | INTRAVENOUS | Status: DC

## 2022-06-11 MED ORDER — ONDANSETRON HCL 4 MG PO TABS
4.0000 mg | ORAL_TABLET | Freq: Three times a day (TID) | ORAL | Status: DC | PRN
  Filled 2022-06-11: qty 1

## 2022-06-11 MED ORDER — ACETAMINOPHEN 160 MG/5ML PO SOLN: 650.0000 mg | ORAL | Status: DC | PRN

## 2022-06-11 MED ORDER — PANTOPRAZOLE SODIUM 40 MG PO TBEC
40.0000 mg | DELAYED_RELEASE_TABLET | Freq: Every day | ORAL | Status: DC
  Administered 2022-06-11 – 2022-06-14 (×4): 40 mg via ORAL
  Filled 2022-06-11 (×4): qty 1

## 2022-06-11 MED ORDER — TOPIRAMATE 25 MG PO TABS
25.0000 mg | ORAL_TABLET | Freq: Two times a day (BID) | ORAL | Status: DC
  Administered 2022-06-11 – 2022-06-13 (×4): 25 mg via ORAL
  Filled 2022-06-11 (×4): qty 1

## 2022-06-11 MED ORDER — SODIUM CHLORIDE 0.9 % IV SOLN: INTRAVENOUS | Status: DC

## 2022-06-11 NOTE — ED Triage Notes (Signed)
Pt BIB [facility] from [facility]. Pt at gym around 1730 yesterday had a constant HA and then fell. Imaging at previous facility showed [place] in occipital lobe. Pt presents to ED with constant HA and light sensitivity. Patient is currently on Cardene @5mg /hr

## 2022-06-11 NOTE — Progress Notes (Signed)
Belongings at bedside: pants, underwear, shoes, socks, and 2 rings. Husband stated he would take rings home.

## 2022-06-11 NOTE — ED Notes (Signed)
Patient to MRI.

## 2022-06-11 NOTE — Progress Notes (Signed)
Dr. Leonie Man stated BP goals for 24 hrs: SBP 120-140

## 2022-06-11 NOTE — ED Notes (Signed)
PT TRANSPORTED Shelley Harper AT [place] ED

## 2022-06-11 NOTE — H&P (Signed)
NEUROLOGY CONSULTATION NOTE   Date of service: June 11, 2022 Patient Name: Shelley Harper MRN:  [id number] DOB:  09/18/1970 _ _ _   _ __   _ __ _ _  __ __   _ __   __ _  History of Present Illness  Shelley Harper is a 51 y.o. female with no significant past medical history who reports sudden onset thunderclap headache at 1730 on 06/10/2022 while she was at the gym exercising. She almost blacked out and fell but did not hit her head. She had an identical episode on Friday, 06/07/2022 which happened while she was again exercising.  The headache however, went away spontaneously on Friday but is persistent 9/10 today. She came into the ED where CT head demonstrated acute intraparenchymal hematoma within the right occipital lobe with small volume cytotoxic edema and small volume subarachnoid hemorrhage.  She denies any anticoagulation or antiplatelet agents.  She does not use any recreational substances.  No prior history of similar episodes.  Reports her father had stroke in 33s.  She has implantable birth control that is due for replacement.  Husband reports that she had a similar headache  LKW: 1730 on 06/10/2022. mRS: 0 ICH score: 0 tNKASE: Not offered due to [place]. Thrombectomy: Not offered due to [place]. NIHSS components Score: Comment  1a Level of Conscious 0[x]  1[]  2[]  3[]      1b LOC Questions 0[x]  1[]  2[]       1c LOC Commands 0[x]  1[]  2[]       2 Best Gaze 0[x]  1[]  2[]       3 Visual 0[x]  1[]  2[]  3[]      4 Facial Palsy 0[x]  1[]  2[]  3[]      5a Motor Arm - left 0[x]  1[]  2[]  3[]  4[]  UN[]    5b Motor Arm - Right 0[x]  1[]  2[]  3[]  4[]  UN[]    6a Motor Leg - Left 0[x]  1[]  2[]  3[]  4[]  UN[]    6b Motor Leg - Right 0[x]  1[]  2[]  3[]  4[]  UN[]    7 Limb Ataxia 0[x]  1[]  2[]  3[]  UN[]     8 Sensory 0[x]  1[]  2[]  UN[]      9 Best Language 0[x]  1[]  2[]  3[]      10 Dysarthria 0[x]  1[]  2[]  UN[]      11 Extinct. and Inattention 0[x]  1[]  2[]       TOTAL: 0       ROS   Constitutional Denies  weight loss, fever and chills.   HEENT Denies changes in vision and hearing.   Respiratory Denies SOB and cough.   CV Denies palpitations and CP   GI Denies abdominal pain, nausea, vomiting and diarrhea.   GU Denies dysuria and urinary frequency.   MSK Denies myalgia and joint pain.   Skin Denies rash and pruritus.   Neurological + headache and syncope.   Psychiatric Denies recent changes in mood. Denies anxiety and depression.    Past History   Past Medical History:  Diagnosis Date   No known health problems    Past Surgical History:  Procedure Laterality Date   NO PAST SURGERIES     Family History  Problem Relation Age of Onset   Breast cancer Maternal Grandmother 3   Social History   Socioeconomic History   Marital status: Married    Spouse name: Not on file   Number of children: Not on file   Years of education: Not on file   Highest education level: Not on file  Occupational History   Not on  file  Tobacco Use   Smoking status: Never   Smokeless tobacco: Never  Vaping Use   Vaping Use: Never used  Substance and Sexual Activity   Alcohol use: Never   Drug use: Never   Sexual activity: Yes    Partners: Male    Birth control/protection: I.U.D.    Comment: Mirena  Other Topics Concern   Not on file  Social History Narrative   Not on file   Social Determinants of Health   Financial Resource Strain: Not on file  Food Insecurity: Not on file  Transportation Needs: Not on file  Physical Activity: Not on file  Stress: Not on file  Social Connections: Not on file   No Known Allergies  Medications  (Not in a hospital admission)    Vitals   Vitals:   06/11/22 0222 06/11/22 0223  BP:  120/66  Pulse:  66  Resp:  16  Temp:  98.5 F (36.9 C)  TempSrc:  Oral  SpO2:  99%  Weight: 72.6 kg   Height: 5\' 3"  (1.6 m)      Body mass index is 28.34 kg/m.  Physical Exam   General: Laying comfortably in bed; in no acute distress.  HENT: Normal  oropharynx and mucosa. Normal external appearance of ears and nose.  Neck: Supple, no pain or tenderness  CV: No JVD. No peripheral edema.  Pulmonary: Symmetric Chest rise. Normal respiratory effort.  Abdomen: Soft to touch, non-tender.  Ext: No cyanosis, edema, or deformity  Skin: No rash. Normal palpation of skin.   Musculoskeletal: Normal digits and nails by inspection. No clubbing.   Neurologic Examination  Mental status/Cognition: Alert, oriented to self, place, month and year, good attention.  Speech/language: Fluent, comprehension intact, object naming intact, repetition intact.  Cranial nerves:   CN II Pupils equal and reactive to light, no VF deficits    CN III,IV,VI EOM intact, no gaze preference or deviation, no nystagmus    CN V normal sensation in V1, V2, and V3 segments bilaterally    CN VII no asymmetry, no nasolabial fold flattening    CN VIII normal hearing to speech    CN IX & X normal palatal elevation, no uvular deviation    CN XI 5/5 head turn and 5/5 shoulder shrug bilaterally    CN XII midline tongue protrusion    Motor:  Muscle bulk: normal, tone normal, pronator drift none tremor none Mvmt Root Nerve  Muscle Right Left Comments  SA C5/6 Ax Deltoid     EF C5/6 Mc Biceps 5 5   EE C6/7/8 Rad Triceps 5 5   WF C6/7 Med FCR     WE C7/8 PIN ECU     F Ab C8/T1 U ADM/FDI 5 5   HF L1/2/3 Fem Illopsoas 5 5   KE L2/3/4 Fem Quad     DF L4/5 D Peron Tib Ant 5 5   PF S1/2 Tibial Grc/Sol 5 5    Sensation:  Light touch Intact throughout   Pin prick    Temperature    Vibration   Proprioception    Coordination/Complex Motor:  - Finger to Nose intact BL - Heel to shin too exhausted to attempt - Rapid alternating movement slowed BL - Gait: Deferred for patient safety.  Labs   CBC:  Recent Labs  Lab 06/10/22 2137  WBC 13.3*  NEUTROABS 10.9*  HGB 12.0  HCT 37.4  MCV 86.8  PLT 350    Basic Metabolic Panel:  Lab Results  Component Value Date   NA 138  06/10/2022   K 3.7 06/10/2022   CO2 22 06/10/2022   GLUCOSE 153 (H) 06/10/2022   BUN 16 06/10/2022   CREATININE 0.65 06/10/2022   CALCIUM 9.3 06/10/2022   GFRNONAA >60 06/10/2022   Lipid Panel: No results found for: "LDLCALC" HgbA1c: No results found for: "HGBA1C" Urine Drug Screen: No results found for: "LABOPIA", "COCAINSCRNUR", "LABBENZ", "AMPHETMU", "THCU", "LABBARB"  Alcohol Level No results found for: "ETH"  CT Head without contrast(Personally reviewed): CTH was negative for a large hypodensity concerning for a large territory infarct or hyperdensity concerning for an ICH  CT angio Head and Neck with contrast(Personally reviewed): No LVO  MRI Brain: pending  Impression   Shelley Harper is a 51 y.o. female with no significant PMH who presents with thunderclap headache x 2 and found to have R occipital IPH on CT head. Etiology is unclear differential includes Hemorrhagic RCVS, PACNS, underlying AVM/cavernoma, potential hypertensive hemorrhage, underlying malignancy are all considerations.  Recommendations  Right occipital lobe ICH with ICH score of 0: etiology is unclear, suspect hemorrhagic RCVS given thunderclap headache at onset. - Admit to ICU - Stability scan in 6 hours or STAT with any neurological decline - Frequent neuro checks; q8min for 1 hour, then q1hour - No antiplatelets or anticoagulants due to [place] - SCD for DVT prophylaxis, pharmacological DVT ppx at 24 hours if ICH is stable - Blood pressure control with goal systolic [id number] - [id number], cleverplex and labetalol PRN - Stroke labs, HgbA1c, fasting lipid panel - MRI brain with and without contrast when stabilized to evaluate for underlying mass - Risk factor modification - Echocardiogram - PT consult, OT consult, Speech consult. - consider cerebral angiogram in AM.   This patient is critically ill and at significant risk of neurological worsening, death and care requires constant monitoring of vital  signs, hemodynamics,respiratory and cardiac monitoring, neurological assessment, discussion with family, other specialists and medical decision making of high complexity. I spent 45 minutes of neurocritical care time  in the care of  this patient. This was time spent independent of any time provided by nurse practitioner or PA.  [place] Pager Number [phone] 06/11/2022  3:12 AM    ______________________________________________________________________   Thank you for the opportunity to take part in the care of this patient. If you have any further questions, please contact the neurology consultation attending.  Signed,  [place] Pager Number [phone] _ _ _   _ __   _ __ _ _  __ __   _ __   __ _

## 2022-06-11 NOTE — Progress Notes (Signed)
  Echocardiogram 2D Echocardiogram has been performed.  Darlina Sicilian M 06/11/2022, 2:27 PM

## 2022-06-11 NOTE — ED Provider Notes (Signed)
[facility] EMERGENCY DEPARTMENT Provider Note   CSN: [id number] Arrival date & time: 06/11/22  0214     History  Chief Complaint  Patient presents with   Head Injury    Shelley Harper is a 51 y.o. female.  HPI     This is a 51 year old female who presents as a transfer from [facility].  She presented there with worsening acute headache.  She was found to have a large intraparenchymal hematoma.  Has noted spotty vision but no other strokelike symptoms.  On my evaluation, she continues to endorse headache which she states is stable from prior.  Blood pressure 893 systolic on drip.  Home Medications Prior to Admission medications   Medication Sig Start Date End Date Taking? Authorizing Provider  levonorgestrel (MIRENA) 20 MCG/24HR IUD 1 each by Intrauterine route once.    [provider]      Allergies    Patient has no known allergies.    Review of Systems   Review of Systems  Eyes:  Positive for visual disturbance.  Neurological:  Positive for headaches.  All other systems reviewed and are negative.   Physical Exam Updated Vital Signs BP 120/66 (BP Location: Right Arm)   Pulse 66   Temp 98.5 F (36.9 C) (Oral)   Resp 16   Ht 1.6 m (5\' 3" )   Wt 72.6 kg   SpO2 99%   BMI 28.34 kg/m  Physical Exam Vitals and nursing note reviewed.  Constitutional:      Appearance: She is well-developed. She is ill-appearing. She is not toxic-appearing.  HENT:     Head: Normocephalic and atraumatic.     Mouth/Throat:     Mouth: Mucous membranes are moist.  Eyes:     Extraocular Movements: Extraocular movements intact.     Pupils: Pupils are equal, round, and reactive to light.  Cardiovascular:     Rate and Rhythm: Normal rate and regular rhythm.  Pulmonary:     Effort: Pulmonary effort is normal. No respiratory distress.  Abdominal:     Palpations: Abdomen is soft.  Musculoskeletal:     Cervical back: Neck supple.  Skin:     General: Skin is warm and dry.  Neurological:     Mental Status: She is alert and oriented to person, place, and time.     Comments: Follow simple commands, moves all 4 extremities,  Psychiatric:        Mood and Affect: Mood normal.     ED Results / Procedures / Treatments   Labs (all labs ordered are listed, but only abnormal results are displayed) Labs Reviewed - No data to display  EKG None  Radiology CT VENOGRAM HEAD  Result Date: 06/10/2022 CLINICAL DATA:  Intracranial hemorrhage EXAM: CT VENOGRAM HEAD TECHNIQUE: Venographic phase images of the brain were obtained following the administration of intravenous contrast. Multiplanar reformats and maximum intensity projections were generated. RADIATION DOSE REDUCTION: This exam was performed according to the departmental dose-optimization program which includes automated exposure control, adjustment of the mA and/or kV according to patient size and/or use of iterative reconstruction technique. CONTRAST:  52mL OMNIPAQUE IOHEXOL 350 MG/ML SOLN COMPARISON:  None Available. FINDINGS: Unchanged size of right occipital intraparenchymal hematoma. Superior sagittal sinus: Normal. Straight sinus: Normal. Inferior sagittal sinus, vein of Galen and internal cerebral veins: Normal. Transverse sinuses: Normal. Sigmoid sinuses: Normal. Visualized jugular veins: Normal. IMPRESSION: 1. No evidence of dural venous sinus thrombosis. 2. Unchanged size of right occipital intraparenchymal hematoma.  Electronically Signed   By: Deatra Robinson M.D.   On: 06/10/2022 23:34   CT ANGIO HEAD NECK W WO CM  Result Date: 06/10/2022 CLINICAL DATA:  Syncopal event.  Severe headache and vomiting EXAM: CT ANGIOGRAPHY HEAD AND NECK TECHNIQUE: Multidetector CT imaging of the head and neck was performed using the standard protocol during bolus administration of intravenous contrast. Multiplanar CT image reconstructions and MIPs were obtained to evaluate the vascular anatomy.  Carotid stenosis measurements (when applicable) are obtained utilizing NASCET criteria, using the distal internal carotid diameter as the denominator. RADIATION DOSE REDUCTION: This exam was performed according to the departmental dose-optimization program which includes automated exposure control, adjustment of the mA and/or kV according to patient size and/or use of iterative reconstruction technique. CONTRAST:  49mL OMNIPAQUE IOHEXOL 350 MG/ML SOLN COMPARISON:  Head CT 06/10/2022 FINDINGS: Unchanged size of intraparenchymal hematoma centered in the right occipital lobe. CTA NECK FINDINGS SKELETON: There is no bony spinal canal stenosis. No lytic or blastic lesion. OTHER NECK: Normal pharynx, larynx and major salivary glands. No cervical lymphadenopathy. Unremarkable thyroid gland. UPPER CHEST: No pneumothorax or pleural effusion. No nodules or masses. AORTIC ARCH: There is no calcific atherosclerosis of the aortic arch. There is no aneurysm, dissection or hemodynamically significant stenosis of the visualized portion of the aorta. Conventional 3 vessel aortic branching pattern. The visualized proximal subclavian arteries are widely patent. RIGHT CAROTID SYSTEM: Normal without aneurysm, dissection or stenosis. LEFT CAROTID SYSTEM: Normal without aneurysm, dissection or stenosis. VERTEBRAL ARTERIES: Left dominant configuration. Both origins are clearly patent. There is no dissection, occlusion or flow-limiting stenosis to the skull base (V1-V3 segments). CTA HEAD FINDINGS POSTERIOR CIRCULATION: --Vertebral arteries: Normal V4 segments. --Inferior cerebellar arteries: Normal. --Basilar artery: Normal. --Superior cerebellar arteries: Normal. --Posterior cerebral arteries (PCA): Normal. ANTERIOR CIRCULATION: --Intracranial internal carotid arteries: Normal. --Anterior cerebral arteries (ACA): Normal. Both A1 segments are present. Patent anterior communicating artery (a-comm). --Middle cerebral arteries (MCA): Normal.  VENOUS SINUSES: As permitted by contrast timing, patent. ANATOMIC VARIANTS: None Review of the MIP images confirms the above findings. IMPRESSION: 1. No emergent large vessel occlusion or high-grade stenosis of the intracranial arteries. 2. No aneurysm or vascular malformation. 3. Unchanged size of intraparenchymal hematoma centered in the right occipital lobe. Electronically Signed   By: Deatra Robinson M.D.   On: 06/10/2022 23:32   CT Head Wo Contrast  Addendum Date: 06/10/2022   ADDENDUM REPORT: 06/10/2022 22:25 ADDENDUM: These results were called by telephone at the time of interpretation on 06/10/2022 at 10:10 pm to provider Erma Heritage, MD, who verbally acknowledged these results. Electronically Signed   By: Helyn Numbers M.D.   On: 06/10/2022 22:25   Result Date: 06/10/2022 CLINICAL DATA:  Head trauma, moderate-severe; Facial trauma, blunt; Neck trauma, midline tenderness (Age 37-64y). Headache, vomiting. EXAM: CT HEAD WITHOUT CONTRAST CT MAXILLOFACIAL WITHOUT CONTRAST CT CERVICAL SPINE WITHOUT CONTRAST TECHNIQUE: Multidetector CT imaging of the head, cervical spine, and maxillofacial structures were performed using the standard protocol without intravenous contrast. Multiplanar CT image reconstructions of the cervical spine and maxillofacial structures were also generated. RADIATION DOSE REDUCTION: This exam was performed according to the departmental dose-optimization program which includes automated exposure control, adjustment of the mA and/or kV according to patient size and/or use of iterative reconstruction technique. COMPARISON:  None Available. FINDINGS: CT HEAD FINDINGS Brain: There is an acute intraparenchymal hematoma within the right occipital lobe measuring 2.1 x 2.8 x 2.7 cm (volume = 8.3 cm^3) with a small amount of surrounding cytotoxic  edema. Small volume subarachnoid hemorrhage is seen within this region within the sulci of the right parietal and occipital lobes. Mild mass effect with  effacement of the overlying sulci. No midline shift. Ventricular size is normal. Cerebellum is unremarkable. Vascular: No hyperdense vessel or unexpected calcification. Skull: Normal. Negative for fracture or focal lesion. Other: Mastoid air cells and middle ear cavities are clear CT MAXILLOFACIAL FINDINGS Osseous: No fracture or mandibular dislocation. No destructive process. Orbits: Negative. No traumatic or inflammatory finding. Sinuses: Clear. Soft tissues: Mild soft tissue swelling superficial to the left zygomaticomaxillary suture. CT CERVICAL SPINE FINDINGS Alignment: Normal. Skull base and vertebrae: Craniocervical alignment is normal. The atlantodental interval is not widened. No acute fracture of the cervical spine. Vertebral body height is preserved. Soft tissues and spinal canal: No prevertebral fluid or swelling. No visible canal hematoma. Disc levels: Mild intervertebral disc space narrowing and endplate remodeling at C5-6 and C6-7 is present in keeping with changes of mild to moderate degenerative disc disease. Prevertebral soft tissues are not thickened on sagittal reformats. The spinal canal is widely patent. Asymmetric facet arthrosis results in severe right neuroforaminal narrowing at C3-4. Upper chest: Negative. Other: None IMPRESSION: 1. Acute intraparenchymal hematoma within the right occipital lobe with small volume surrounding cytotoxic edema and small volume subarachnoid hemorrhage. No midline shift. 2. No acute fracture or listhesis of the cervical spine. 3. No acute facial fracture. Mild soft tissue swelling superficial to the left zygomaticomaxillary suture. Electronically Signed: By: Helyn NumbersAshesh  Parikh M.D. On: 06/10/2022 22:04   CT Cervical Spine Wo Contrast  Addendum Date: 06/10/2022   ADDENDUM REPORT: 06/10/2022 22:25 ADDENDUM: These results were called by telephone at the time of interpretation on 06/10/2022 at 10:10 pm to provider Erma HeritageIsaacs, MD, who verbally acknowledged these results.  Electronically Signed   By: Helyn NumbersAshesh  Parikh M.D.   On: 06/10/2022 22:25   Result Date: 06/10/2022 CLINICAL DATA:  Head trauma, moderate-severe; Facial trauma, blunt; Neck trauma, midline tenderness (Age 67-64y). Headache, vomiting. EXAM: CT HEAD WITHOUT CONTRAST CT MAXILLOFACIAL WITHOUT CONTRAST CT CERVICAL SPINE WITHOUT CONTRAST TECHNIQUE: Multidetector CT imaging of the head, cervical spine, and maxillofacial structures were performed using the standard protocol without intravenous contrast. Multiplanar CT image reconstructions of the cervical spine and maxillofacial structures were also generated. RADIATION DOSE REDUCTION: This exam was performed according to the departmental dose-optimization program which includes automated exposure control, adjustment of the mA and/or kV according to patient size and/or use of iterative reconstruction technique. COMPARISON:  None Available. FINDINGS: CT HEAD FINDINGS Brain: There is an acute intraparenchymal hematoma within the right occipital lobe measuring 2.1 x 2.8 x 2.7 cm (volume = 8.3 cm^3) with a small amount of surrounding cytotoxic edema. Small volume subarachnoid hemorrhage is seen within this region within the sulci of the right parietal and occipital lobes. Mild mass effect with effacement of the overlying sulci. No midline shift. Ventricular size is normal. Cerebellum is unremarkable. Vascular: No hyperdense vessel or unexpected calcification. Skull: Normal. Negative for fracture or focal lesion. Other: Mastoid air cells and middle ear cavities are clear CT MAXILLOFACIAL FINDINGS Osseous: No fracture or mandibular dislocation. No destructive process. Orbits: Negative. No traumatic or inflammatory finding. Sinuses: Clear. Soft tissues: Mild soft tissue swelling superficial to the left zygomaticomaxillary suture. CT CERVICAL SPINE FINDINGS Alignment: Normal. Skull base and vertebrae: Craniocervical alignment is normal. The atlantodental interval is not widened. No  acute fracture of the cervical spine. Vertebral body height is preserved. Soft tissues and spinal canal: No prevertebral fluid  or swelling. No visible canal hematoma. Disc levels: Mild intervertebral disc space narrowing and endplate remodeling at C5-6 and C6-7 is present in keeping with changes of mild to moderate degenerative disc disease. Prevertebral soft tissues are not thickened on sagittal reformats. The spinal canal is widely patent. Asymmetric facet arthrosis results in severe right neuroforaminal narrowing at C3-4. Upper chest: Negative. Other: None IMPRESSION: 1. Acute intraparenchymal hematoma within the right occipital lobe with small volume surrounding cytotoxic edema and small volume subarachnoid hemorrhage. No midline shift. 2. No acute fracture or listhesis of the cervical spine. 3. No acute facial fracture. Mild soft tissue swelling superficial to the left zygomaticomaxillary suture. Electronically Signed: By: Helyn Numbers M.D. On: 06/10/2022 22:04   CT Maxillofacial Wo Contrast  Addendum Date: 06/10/2022   ADDENDUM REPORT: 06/10/2022 22:25 ADDENDUM: These results were called by telephone at the time of interpretation on 06/10/2022 at 10:10 pm to provider Erma Heritage, MD, who verbally acknowledged these results. Electronically Signed   By: Helyn Numbers M.D.   On: 06/10/2022 22:25   Result Date: 06/10/2022 CLINICAL DATA:  Head trauma, moderate-severe; Facial trauma, blunt; Neck trauma, midline tenderness (Age 64-64y). Headache, vomiting. EXAM: CT HEAD WITHOUT CONTRAST CT MAXILLOFACIAL WITHOUT CONTRAST CT CERVICAL SPINE WITHOUT CONTRAST TECHNIQUE: Multidetector CT imaging of the head, cervical spine, and maxillofacial structures were performed using the standard protocol without intravenous contrast. Multiplanar CT image reconstructions of the cervical spine and maxillofacial structures were also generated. RADIATION DOSE REDUCTION: This exam was performed according to the departmental  dose-optimization program which includes automated exposure control, adjustment of the mA and/or kV according to patient size and/or use of iterative reconstruction technique. COMPARISON:  None Available. FINDINGS: CT HEAD FINDINGS Brain: There is an acute intraparenchymal hematoma within the right occipital lobe measuring 2.1 x 2.8 x 2.7 cm (volume = 8.3 cm^3) with a small amount of surrounding cytotoxic edema. Small volume subarachnoid hemorrhage is seen within this region within the sulci of the right parietal and occipital lobes. Mild mass effect with effacement of the overlying sulci. No midline shift. Ventricular size is normal. Cerebellum is unremarkable. Vascular: No hyperdense vessel or unexpected calcification. Skull: Normal. Negative for fracture or focal lesion. Other: Mastoid air cells and middle ear cavities are clear CT MAXILLOFACIAL FINDINGS Osseous: No fracture or mandibular dislocation. No destructive process. Orbits: Negative. No traumatic or inflammatory finding. Sinuses: Clear. Soft tissues: Mild soft tissue swelling superficial to the left zygomaticomaxillary suture. CT CERVICAL SPINE FINDINGS Alignment: Normal. Skull base and vertebrae: Craniocervical alignment is normal. The atlantodental interval is not widened. No acute fracture of the cervical spine. Vertebral body height is preserved. Soft tissues and spinal canal: No prevertebral fluid or swelling. No visible canal hematoma. Disc levels: Mild intervertebral disc space narrowing and endplate remodeling at C5-6 and C6-7 is present in keeping with changes of mild to moderate degenerative disc disease. Prevertebral soft tissues are not thickened on sagittal reformats. The spinal canal is widely patent. Asymmetric facet arthrosis results in severe right neuroforaminal narrowing at C3-4. Upper chest: Negative. Other: None IMPRESSION: 1. Acute intraparenchymal hematoma within the right occipital lobe with small volume surrounding cytotoxic  edema and small volume subarachnoid hemorrhage. No midline shift. 2. No acute fracture or listhesis of the cervical spine. 3. No acute facial fracture. Mild soft tissue swelling superficial to the left zygomaticomaxillary suture. Electronically Signed: By: Helyn Numbers M.D. On: 06/10/2022 22:04   DG Chest Port 1 View  Result Date: 06/10/2022 CLINICAL DATA:  Syncope, intracranial  hemorrhage EXAM: PORTABLE CHEST 1 VIEW COMPARISON:  None Available. FINDINGS: The heart size and mediastinal contours are within normal limits. Both lungs are clear. The visualized skeletal structures are unremarkable. IMPRESSION: No active disease. Electronically Signed   By: Helyn Numbers M.D.   On: 06/10/2022 22:13    Procedures Procedures    Medications Ordered in ED Medications - No data to display  ED Course/ Medical Decision Making/ A&P                           Medical Decision Making Risk Decision regarding hospitalization.   This patient presents to the ED for concern of headache, ICH, this involves an extensive number of treatment options, and is a complaint that carries with it a high risk of complications and morbidity.  I considered the following differential and admission for this acute, potentially life threatening condition.  The differential diagnosis includes ICH  MDM:    This 51 year old who is transferred from [facility] for ICH.  She is stable.  Blood pressure improved on drip in route.  Neurology was advised of her arrival.  Anticipate she will be admitted  (Labs, imaging, consults)  Labs: I Ordered, and personally interpreted labs.  The pertinent results include: None  Imaging Studies ordered: I ordered imaging studies including repeat CT I independently visualized and interpreted imaging. I agree with the radiologist interpretation  Additional history obtained from chart review.  External records from outside source obtained and reviewed including outside ED  visit  Cardiac Monitoring: The patient was maintained on a cardiac monitor.  I personally viewed and interpreted the cardiac monitored which showed an underlying rhythm of: Sinus rhythm  Reevaluation: After the interventions noted above, I reevaluated the patient and found that they have :stayed the same  Social Determinants of Health: Lives independently  Disposition: Admit  Co morbidities that complicate the patient evaluation  Past Medical History:  Diagnosis Date   No known health problems      Medicines No orders of the defined types were placed in this encounter.   I have reviewed the patients home medicines and have made adjustments as needed  Problem List / ED Course: Problem List Items Addressed This Visit   None Visit Diagnoses     Right-sided nontraumatic intracerebral hemorrhage, unspecified cerebral location [facility])    -  Primary                   Final Clinical Impression(s) / ED Diagnoses Final diagnoses:  Right-sided nontraumatic intracerebral hemorrhage, unspecified cerebral location [facility])    Rx / DC Orders ED Discharge Orders     None         Shon Baton, MD 06/11/22 [phone]

## 2022-06-11 NOTE — Progress Notes (Addendum)
STROKE TEAM PROGRESS NOTE   INTERVAL HISTORY Her husband is at the bedside.  Patient reports having a headache which started when she was in the gym yesterday. She also had a similar episode last Friday in the gym as well. She denies medication use, smoking, alcohol, and illicit drug use. She is a Agricultural consultant at [employer]. Lives with husband and two children.   MRI  Superior right occipital lobe intra-axial hematoma, just caudal to the parieto-occipital sulcus is stable measuring 12 mL. Mild regional edema with minimal associated mass effect. No evidence of underlying mass or lesion. She denies any history of migraine headaches.  She is not on any birth control pills and does not take any medications Vitals:   06/11/22 0830 06/11/22 1000 06/11/22 1100 06/11/22 1200  BP: 104/63 125/70 124/66 107/67  Pulse: (!) 57 (!) 55 (!) 57 (!) 53  Resp: 13 11 15 14   Temp:    98.8 F (37.1 C)  TempSrc:    Axillary  SpO2: 97% 96% 95% 95%  Weight:      Height:       CBC:  Recent Labs  Lab 06/10/22 2137  WBC 13.3*  NEUTROABS 10.9*  HGB 12.0  HCT 37.4  MCV 86.8  PLT 672   Basic Metabolic Panel:  Recent Labs  Lab 06/10/22 2137  NA 138  K 3.7  CL 104  CO2 22  GLUCOSE 153*  BUN 16  CREATININE 0.65  CALCIUM 9.3   Lipid Panel: No results for input(s): "CHOL", "TRIG", "HDL", "CHOLHDL", "VLDL", "LDLCALC" in the last 168 hours. HgbA1c: No results for input(s): "HGBA1C" in the last 168 hours. Urine Drug Screen:  Recent Labs  Lab 06/11/22 1031  LABOPIA POSITIVE*  COCAINSCRNUR NONE DETECTED  LABBENZ NONE DETECTED  AMPHETMU NONE DETECTED  THCU NONE DETECTED  LABBARB NONE DETECTED    Alcohol Level No results for input(s): "ETH" in the last 168 hours.  IMAGING past 24 hours MR BRAIN W WO CONTRAST  Result Date: 06/11/2022 CLINICAL DATA:  51 year old  female presenting with severe headache and vomiting, intra-axial hemorrhage in the posterior right hemisphere yesterday. EXAM: MRI HEAD  WITHOUT AND WITH CONTRAST TECHNIQUE: Multiplanar, multiecho pulse sequences of the brain and surrounding structures were obtained without and with intravenous contrast. CONTRAST:  7.3 mL Vueway COMPARISON:  Head CT 0355 hours today.  CTA/CTV yesterday. FINDINGS: Brain: Oval T1 hypointense to isointense and T2 hyperintense intra-axial hematoma in the posterior right hemisphere just below the right parieto-occipital sulcus encompasses 30 x 30 by 26 mm by MRI (AP by transverse by CC), estimated at 12 mL as on CT. Blood products susceptibility on SWI imaging. Following contrast, no abnormal enhancement. Mild regional edema. But no significant mass effect. No intraventricular extension or ventriculomegaly. Trace subarachnoid hemorrhage better demonstrated by CT. No larger area of diffusion abnormality there, blood product related susceptibility on DWI. No midline shift, and basilar cisterns remain normal. Cervicomedullary junction and pituitary are within normal limits. No dural thickening or enhancement. No definite chronic cortical encephalomalacia or chronic cerebral blood products. Deep gray nuclei, brainstem and cerebellum appear negative. Vascular: Major intracranial vascular flow voids are preserved. The distal left vertebral artery appears dominant. Major dural venous sinuses are enhancing and appear to be patent. Skull and upper cervical spine: Negative. Visualized bone marrow signal is within normal limits. Sinuses/Orbits: Negative orbits. Paranasal Visualized paranasal sinuses and mastoids are clear. Other: Visible internal auditory structures appear normal. Negative visible scalp and face. IMPRESSION: 1. Superior right  occipital lobe intra-axial hematoma, just caudal to the parieto-occipital sulcus is stable measuring 12 mL. Mild regional edema with minimal associated mass effect. No evidence of underlying mass or lesion. Consider a follow-up MRI without and with contrast once the blood products have  resolved. 2. Trace subarachnoid hemorrhage better demonstrated by CT. 3. Elsewhere negative MRI appearance of the brain. Electronically Signed   By: Odessa Fleming M.D.   On: 06/11/2022 08:24   CT HEAD WO CONTRAST ( )  Result Date: 06/11/2022 CLINICAL DATA:  51 year old female presenting with severe headache and vomiting, intra-axial hemorrhage in the posterior right hemisphere yesterday. Subsequent encounter. EXAM: CT HEAD WITHOUT CONTRAST TECHNIQUE: Contiguous axial images were obtained from the base of the skull through the vertex without intravenous contrast. RADIATION DOSE REDUCTION: This exam was performed according to the departmental dose-optimization program which includes automated exposure control, adjustment of the mA and/or kV according to patient size and/or use of iterative reconstruction technique. COMPARISON:  CT head, CTA and CTP yesterday. FINDINGS: Brain: Hyperdense intra-axial hemorrhage in the posterior right hemisphere centered near the right parieto-occipital sulcus encompasses 28 x 30 by 29 mm (AP by transverse by CC) versus 27 by 31 by 28 mm winds measured with the same technique yesterday. Extubated intra-axial volume is 12 mL, stable. Associated regional subarachnoid extension of blood is stable and relatively mild (sagittal image 21). Surrounding edema has not significantly changed. No intraventricular hemorrhage or ventriculomegaly. No significant intracranial mass effect and basilar cisterns remain normal. Stable gray-white matter differentiation elsewhere. Vascular: No suspicious intracranial vascular hyperdensity. Skull: Negative. Sinuses/Orbits: Visualized paranasal sinuses and mastoids are stable and well aerated. Other: No acute orbit or scalp soft tissue finding. IMPRESSION: 1. Stable intra-axial hemorrhage (approximately 12 mL) in the posterior right hemisphere centered near the right parieto-occipital sulcus. Stable surrounding edema and mild regional subarachnoid extension of  blood. 2. No significant intracranial mass effect and no new intracranial abnormality. Electronically Signed   By: Odessa Fleming M.D.   On: 06/11/2022 04:14   CT VENOGRAM HEAD  Result Date: 06/10/2022 CLINICAL DATA:  Intracranial hemorrhage EXAM: CT VENOGRAM HEAD TECHNIQUE: Venographic phase images of the brain were obtained following the administration of intravenous contrast. Multiplanar reformats and maximum intensity projections were generated. RADIATION DOSE REDUCTION: This exam was performed according to the departmental dose-optimization program which includes automated exposure control, adjustment of the mA and/or kV according to patient size and/or use of iterative reconstruction technique. CONTRAST:  25mL OMNIPAQUE IOHEXOL 350 MG/ML SOLN COMPARISON:  None Available. FINDINGS: Unchanged size of right occipital intraparenchymal hematoma. Superior sagittal sinus: Normal. Straight sinus: Normal. Inferior sagittal sinus, vein of Galen and internal cerebral veins: Normal. Transverse sinuses: Normal. Sigmoid sinuses: Normal. Visualized jugular veins: Normal. IMPRESSION: 1. No evidence of dural venous sinus thrombosis. 2. Unchanged size of right occipital intraparenchymal hematoma. Electronically Signed   By: Deatra Robinson M.D.   On: 06/10/2022 23:34   CT ANGIO HEAD NECK W WO CM  Result Date: 06/10/2022 CLINICAL DATA:  Syncopal event.  Severe headache and vomiting EXAM: CT ANGIOGRAPHY HEAD AND NECK TECHNIQUE: Multidetector CT imaging of the head and neck was performed using the standard protocol during bolus administration of intravenous contrast. Multiplanar CT image reconstructions and MIPs were obtained to evaluate the vascular anatomy. Carotid stenosis measurements (when applicable) are obtained utilizing NASCET criteria, using the distal internal carotid diameter as the denominator. RADIATION DOSE REDUCTION: This exam was performed according to the departmental dose-optimization program which includes  automated exposure control, adjustment  of the mA and/or kV according to patient size and/or use of iterative reconstruction technique. CONTRAST:  70mL OMNIPAQUE IOHEXOL 350 MG/ML SOLN COMPARISON:  Head CT 06/10/2022 FINDINGS: Unchanged size of intraparenchymal hematoma centered in the right occipital lobe. CTA NECK FINDINGS SKELETON: There is no bony spinal canal stenosis. No lytic or blastic lesion. OTHER NECK: Normal pharynx, larynx and major salivary glands. No cervical lymphadenopathy. Unremarkable thyroid gland. UPPER CHEST: No pneumothorax or pleural effusion. No nodules or masses. AORTIC ARCH: There is no calcific atherosclerosis of the aortic arch. There is no aneurysm, dissection or hemodynamically significant stenosis of the visualized portion of the aorta. Conventional 3 vessel aortic branching pattern. The visualized proximal subclavian arteries are widely patent. RIGHT CAROTID SYSTEM: Normal without aneurysm, dissection or stenosis. LEFT CAROTID SYSTEM: Normal without aneurysm, dissection or stenosis. VERTEBRAL ARTERIES: Left dominant configuration. Both origins are clearly patent. There is no dissection, occlusion or flow-limiting stenosis to the skull base (V1-V3 segments). CTA HEAD FINDINGS POSTERIOR CIRCULATION: --Vertebral arteries: Normal V4 segments. --Inferior cerebellar arteries: Normal. --Basilar artery: Normal. --Superior cerebellar arteries: Normal. --Posterior cerebral arteries (PCA): Normal. ANTERIOR CIRCULATION: --Intracranial internal carotid arteries: Normal. --Anterior cerebral arteries (ACA): Normal. Both A1 segments are present. Patent anterior communicating artery (a-comm). --Middle cerebral arteries (MCA): Normal. VENOUS SINUSES: As permitted by contrast timing, patent. ANATOMIC VARIANTS: None Review of the MIP images confirms the above findings. IMPRESSION: 1. No emergent large vessel occlusion or high-grade stenosis of the intracranial arteries. 2. No aneurysm or vascular  malformation. 3. Unchanged size of intraparenchymal hematoma centered in the right occipital lobe. Electronically Signed   By: Deatra Robinson M.D.   On: 06/10/2022 23:32   CT Head Wo Contrast  Addendum Date: 06/10/2022   ADDENDUM REPORT: 06/10/2022 22:25 ADDENDUM: These results were called by telephone at the time of interpretation on 06/10/2022 at 10:10 pm to provider Erma Heritage, MD, who verbally acknowledged these results. Electronically Signed   By: Helyn Numbers M.D.   On: 06/10/2022 22:25   Result Date: 06/10/2022 CLINICAL DATA:  Head trauma, moderate-severe; Facial trauma, blunt; Neck trauma, midline tenderness (Age 35-64y). Headache, vomiting. EXAM: CT HEAD WITHOUT CONTRAST CT MAXILLOFACIAL WITHOUT CONTRAST CT CERVICAL SPINE WITHOUT CONTRAST TECHNIQUE: Multidetector CT imaging of the head, cervical spine, and maxillofacial structures were performed using the standard protocol without intravenous contrast. Multiplanar CT image reconstructions of the cervical spine and maxillofacial structures were also generated. RADIATION DOSE REDUCTION: This exam was performed according to the departmental dose-optimization program which includes automated exposure control, adjustment of the mA and/or kV according to patient size and/or use of iterative reconstruction technique. COMPARISON:  None Available. FINDINGS: CT HEAD FINDINGS Brain: There is an acute intraparenchymal hematoma within the right occipital lobe measuring 2.1 x 2.8 x 2.7 cm (volume = 8.3 cm^3) with a small amount of surrounding cytotoxic edema. Small volume subarachnoid hemorrhage is seen within this region within the sulci of the right parietal and occipital lobes. Mild mass effect with effacement of the overlying sulci. No midline shift. Ventricular size is normal. Cerebellum is unremarkable. Vascular: No hyperdense vessel or unexpected calcification. Skull: Normal. Negative for fracture or focal lesion. Other: Mastoid air cells and middle ear cavities  are clear CT MAXILLOFACIAL FINDINGS Osseous: No fracture or mandibular dislocation. No destructive process. Orbits: Negative. No traumatic or inflammatory finding. Sinuses: Clear. Soft tissues: Mild soft tissue swelling superficial to the left zygomaticomaxillary suture. CT CERVICAL SPINE FINDINGS Alignment: Normal. Skull base and vertebrae: Craniocervical alignment is normal. The atlantodental interval is  not widened. No acute fracture of the cervical spine. Vertebral body height is preserved. Soft tissues and spinal canal: No prevertebral fluid or swelling. No visible canal hematoma. Disc levels: Mild intervertebral disc space narrowing and endplate remodeling at C5-6 and C6-7 is present in keeping with changes of mild to moderate degenerative disc disease. Prevertebral soft tissues are not thickened on sagittal reformats. The spinal canal is widely patent. Asymmetric facet arthrosis results in severe right neuroforaminal narrowing at C3-4. Upper chest: Negative. Other: None IMPRESSION: 1. Acute intraparenchymal hematoma within the right occipital lobe with small volume surrounding cytotoxic edema and small volume subarachnoid hemorrhage. No midline shift. 2. No acute fracture or listhesis of the cervical spine. 3. No acute facial fracture. Mild soft tissue swelling superficial to the left zygomaticomaxillary suture. Electronically Signed: By: Helyn Numbers M.D. On: 06/10/2022 22:04   CT Cervical Spine Wo Contrast  Addendum Date: 06/10/2022   ADDENDUM REPORT: 06/10/2022 22:25 ADDENDUM: These results were called by telephone at the time of interpretation on 06/10/2022 at 10:10 pm to provider Erma Heritage, MD, who verbally acknowledged these results. Electronically Signed   By: Helyn Numbers M.D.   On: 06/10/2022 22:25   Result Date: 06/10/2022 CLINICAL DATA:  Head trauma, moderate-severe; Facial trauma, blunt; Neck trauma, midline tenderness (Age 68-64y). Headache, vomiting. EXAM: CT HEAD WITHOUT CONTRAST CT  MAXILLOFACIAL WITHOUT CONTRAST CT CERVICAL SPINE WITHOUT CONTRAST TECHNIQUE: Multidetector CT imaging of the head, cervical spine, and maxillofacial structures were performed using the standard protocol without intravenous contrast. Multiplanar CT image reconstructions of the cervical spine and maxillofacial structures were also generated. RADIATION DOSE REDUCTION: This exam was performed according to the departmental dose-optimization program which includes automated exposure control, adjustment of the mA and/or kV according to patient size and/or use of iterative reconstruction technique. COMPARISON:  None Available. FINDINGS: CT HEAD FINDINGS Brain: There is an acute intraparenchymal hematoma within the right occipital lobe measuring 2.1 x 2.8 x 2.7 cm (volume = 8.3 cm^3) with a small amount of surrounding cytotoxic edema. Small volume subarachnoid hemorrhage is seen within this region within the sulci of the right parietal and occipital lobes. Mild mass effect with effacement of the overlying sulci. No midline shift. Ventricular size is normal. Cerebellum is unremarkable. Vascular: No hyperdense vessel or unexpected calcification. Skull: Normal. Negative for fracture or focal lesion. Other: Mastoid air cells and middle ear cavities are clear CT MAXILLOFACIAL FINDINGS Osseous: No fracture or mandibular dislocation. No destructive process. Orbits: Negative. No traumatic or inflammatory finding. Sinuses: Clear. Soft tissues: Mild soft tissue swelling superficial to the left zygomaticomaxillary suture. CT CERVICAL SPINE FINDINGS Alignment: Normal. Skull base and vertebrae: Craniocervical alignment is normal. The atlantodental interval is not widened. No acute fracture of the cervical spine. Vertebral body height is preserved. Soft tissues and spinal canal: No prevertebral fluid or swelling. No visible canal hematoma. Disc levels: Mild intervertebral disc space narrowing and endplate remodeling at C5-6 and C6-7 is  present in keeping with changes of mild to moderate degenerative disc disease. Prevertebral soft tissues are not thickened on sagittal reformats. The spinal canal is widely patent. Asymmetric facet arthrosis results in severe right neuroforaminal narrowing at C3-4. Upper chest: Negative. Other: None IMPRESSION: 1. Acute intraparenchymal hematoma within the right occipital lobe with small volume surrounding cytotoxic edema and small volume subarachnoid hemorrhage. No midline shift. 2. No acute fracture or listhesis of the cervical spine. 3. No acute facial fracture. Mild soft tissue swelling superficial to the left zygomaticomaxillary suture. Electronically Signed: By:  Helyn Numbers M.D. On: 06/10/2022 22:04   CT Maxillofacial Wo Contrast  Addendum Date: 06/10/2022   ADDENDUM REPORT: 06/10/2022 22:25 ADDENDUM: These results were called by telephone at the time of interpretation on 06/10/2022 at 10:10 pm to provider Erma Heritage, MD, who verbally acknowledged these results. Electronically Signed   By: Helyn Numbers M.D.   On: 06/10/2022 22:25   Result Date: 06/10/2022 CLINICAL DATA:  Head trauma, moderate-severe; Facial trauma, blunt; Neck trauma, midline tenderness (Age 81-64y). Headache, vomiting. EXAM: CT HEAD WITHOUT CONTRAST CT MAXILLOFACIAL WITHOUT CONTRAST CT CERVICAL SPINE WITHOUT CONTRAST TECHNIQUE: Multidetector CT imaging of the head, cervical spine, and maxillofacial structures were performed using the standard protocol without intravenous contrast. Multiplanar CT image reconstructions of the cervical spine and maxillofacial structures were also generated. RADIATION DOSE REDUCTION: This exam was performed according to the departmental dose-optimization program which includes automated exposure control, adjustment of the mA and/or kV according to patient size and/or use of iterative reconstruction technique. COMPARISON:  None Available. FINDINGS: CT HEAD FINDINGS Brain: There is an acute intraparenchymal  hematoma within the right occipital lobe measuring 2.1 x 2.8 x 2.7 cm (volume = 8.3 cm^3) with a small amount of surrounding cytotoxic edema. Small volume subarachnoid hemorrhage is seen within this region within the sulci of the right parietal and occipital lobes. Mild mass effect with effacement of the overlying sulci. No midline shift. Ventricular size is normal. Cerebellum is unremarkable. Vascular: No hyperdense vessel or unexpected calcification. Skull: Normal. Negative for fracture or focal lesion. Other: Mastoid air cells and middle ear cavities are clear CT MAXILLOFACIAL FINDINGS Osseous: No fracture or mandibular dislocation. No destructive process. Orbits: Negative. No traumatic or inflammatory finding. Sinuses: Clear. Soft tissues: Mild soft tissue swelling superficial to the left zygomaticomaxillary suture. CT CERVICAL SPINE FINDINGS Alignment: Normal. Skull base and vertebrae: Craniocervical alignment is normal. The atlantodental interval is not widened. No acute fracture of the cervical spine. Vertebral body height is preserved. Soft tissues and spinal canal: No prevertebral fluid or swelling. No visible canal hematoma. Disc levels: Mild intervertebral disc space narrowing and endplate remodeling at C5-6 and C6-7 is present in keeping with changes of mild to moderate degenerative disc disease. Prevertebral soft tissues are not thickened on sagittal reformats. The spinal canal is widely patent. Asymmetric facet arthrosis results in severe right neuroforaminal narrowing at C3-4. Upper chest: Negative. Other: None IMPRESSION: 1. Acute intraparenchymal hematoma within the right occipital lobe with small volume surrounding cytotoxic edema and small volume subarachnoid hemorrhage. No midline shift. 2. No acute fracture or listhesis of the cervical spine. 3. No acute facial fracture. Mild soft tissue swelling superficial to the left zygomaticomaxillary suture. Electronically Signed: By: Helyn Numbers M.D.  On: 06/10/2022 22:04   DG Chest Port 1 View  Result Date: 06/10/2022 CLINICAL DATA:  Syncope, intracranial hemorrhage EXAM: PORTABLE CHEST 1 VIEW COMPARISON:  None Available. FINDINGS: The heart size and mediastinal contours are within normal limits. Both lungs are clear. The visualized skeletal structures are unremarkable. IMPRESSION: No active disease. Electronically Signed   By: Helyn Numbers M.D.   On: 06/10/2022 22:13    PHYSICAL EXAM General: Somnolent, well-appearing in bed. No acute distress. Extremities: Normal ROM. 5/5 strength in RUE, LUE, RLE, and LLE Skin: Warm and dry. No obvious rash or lesions. Neuro: A&Ox3. CN VII, XI, XII intact. No cerebellar abnormalities. Moves all extremities. Normal sensation. Left homonomous hemianopsia. Right visual field preserved. No dysarthria. Psych: Normal mood and affect   ASSESSMENT/PLAN Shelley Cyprus  Shelley Harper is a 51 y.o. female with no significant past medical history who reports sudden onset thunderclap headache at 1730 on 06/10/2022 while she was at the gym exercising.   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 Mild regional edema with minimal associated mass effect. No evidence of underlying mass or lesion. 2D Echo pending LDL No results found for requested labs within last 1095 days. HgbA1c No results found for requested labs within last 1095 days. VTE prophylaxis - lovenox    Diet   Diet clear liquid Room service appropriate? Yes; Fluid consistency: Thin   No anticoagulation indicated at this time due to intracranial bleeding Start IV Zofran,  Topamax Therapy recommendations:  pending Disposition:  home Pending Lipid panel, HA1c  Hypertension Home meds:  none Stable Permissive hypertension (OK if < 220/120) but gradually normalize in 5-7 days Long-term BP goal normotensive Start IV hydralazine 20mg  Q6H PRN IVNS 9275mL/hr   Other Stroke Risk Factors Family hx stroke (father)  Other Active Problems none  Hospital day # 0  Christia Readinganiela Banci, MD PGY-1 Psychiatry  STROKE MD NOTE :  I have personally obtained history,examined this patient, reviewed notes, independently viewed imaging studies, participated in medical decision making and plan of care.ROS completed by me personally and pertinent positives fully documented  I have made any additions or clarifications directly to the above note. Agree with note above.  Patient presented with sudden onset of thunderclap headache while exercising in the gym on last Friday which showed some improvement but headache returned when she went to the gym again on Monday and has persisted.  CT scan and MRI showed small right parieto-occipital parenchymal hematoma with mild cytotoxic edema but no intraventricular extension or midline shift.  Neurological exam is significant only for dense left homonymous hemianopsia and persistent headache.  CT angiogram of the brain and CT venogram did not show significant aneurysm, large vessel stenosis or AVM.  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  He will need cerebral catheter angiogram in the next few days.  Continue strict blood pressure control with systolic goal between 120-140 for 24 hours and then below 160.  Mobilize out of bed.  Therapy consults.  Long discussion with patient and husband at the bedside and answered questions.  Discussed with Dr. Corliss Skainseveshwar neuro interventional radiologist This patient is critically ill and at significant risk of neurological worsening, death and care requires constant  monitoring of vital signs, hemodynamics,respiratory and cardiac monitoring, extensive review of multiple databases, frequent neurological assessment, discussion with family, other specialists and medical decision making of high complexity.I have made any additions or clarifications directly to the above note.This critical care time does not reflect procedure time, or teaching time or supervisory time of PA/NP/Med Resident etc but could involve care discussion time.  I spent 30 minutes of neurocritical care time  in the care of  this patient.     Delia HeadyPramod Maxamilian Amadon, MD Medical Director [facility] [facility] Pager: [phone] 06/11/2022 1:21 PM  To contact Stroke Continuity provider, please refer to [email or web address]. After hours, contact General Neurology

## 2022-06-11 NOTE — Consult Note (Signed)
Chief Complaint: Small SAH  Referring Physician(s): Donnetta Simpers, MD   Supervising Physician: Luanne Bras  Patient Status: [facility] - In-pt  History of Present Illness: Shelley Harper is a 51 y.o. female with sudden onset of headache yesterday at 5:30 pm while she was at the gym exercising.   She almost blacked out and fell but did not hit her head.   She had an identical episode which happened this past Friday while she was exercising.    The headache yesterday did not subside which prompted her to come to the ED.  CT head showed acute intraparenchymal hematoma within the right occipital lobe with small volume cytotoxic edema and small volume subarachnoid hemorrhage.    She does not take blood thinners or antiplatelet agents.  She does not take any medications at home. She is otherwise healthy.  We are asked to perform a diagnostic cerebral angiography.   Past Medical History:  Diagnosis Date   No known health problems     Past Surgical History:  Procedure Laterality Date   NO PAST SURGERIES      Allergies: Patient has no known allergies.  Medications: Prior to Admission medications   Medication Sig Start Date End Date Taking? Authorizing Provider  levonorgestrel (MIRENA) 20 MCG/24HR IUD 1 each by Intrauterine route once.    [provider]     Family History  Problem Relation Age of Onset   Breast cancer Maternal Grandmother 51    Social History   Socioeconomic History   Marital status: Married    Spouse name: Not on file   Number of children: Not on file   Years of education: Not on file   Highest education level: Not on file  Occupational History   Not on file  Tobacco Use   Smoking status: Never   Smokeless tobacco: Never  Vaping Use   Vaping Use: Never used  Substance and Sexual Activity   Alcohol use: Never   Drug use: Never   Sexual activity: Yes    Partners: Male    Birth control/protection: I.U.D.     Comment: Mirena  Other Topics Concern   Not on file  Social History Narrative   Not on file   Social Determinants of Health   Financial Resource Strain: Not on file  Food Insecurity: Not on file  Transportation Needs: Not on file  Physical Activity: Not on file  Stress: Not on file  Social Connections: Not on file     Review of Systems: A 12 point ROS discussed and pertinent positives are indicated in the HPI above.  All other systems are negative.  Review of Systems  Vital Signs: BP 125/70   Pulse (!) 55   Temp 98.3 F (36.8 C) (Oral)   Resp 11   Ht 5\' 3"  (1.6 m)   Wt 160 lb (72.6 kg)   SpO2 96%   BMI 28.34 kg/m   Physical Exam Vitals reviewed.  Constitutional:      Appearance: Normal appearance.  HENT:     Head: Normocephalic and atraumatic.  Eyes:     Extraocular Movements: Extraocular movements intact.  Cardiovascular:     Rate and Rhythm: Normal rate and regular rhythm.  Pulmonary:     Effort: Pulmonary effort is normal. No respiratory distress.     Breath sounds: Normal breath sounds.  Musculoskeletal:        General: Normal range of motion.     Cervical back: Normal range of  motion.  Skin:    General: Skin is warm and dry.  Neurological:     General: No focal deficit present.     Mental Status: She is alert and oriented to person, place, and time.     Comments: Sleepy, easily arousable, oriented x3. Speech and comprehension intact. PERRL bilaterally. EOMs intact bilaterally without nystagmus or subjective diplopia. Visual fields intact bilaterally. No facial asymmetry. Tongue midline. Motor power symmetric proportional to effort. No pronator drift. Fine motor and coordination intact and symmetric. 5/5 Strength bilaterally  Psychiatric:        Mood and Affect: Mood normal.        Behavior: Behavior normal.        Thought Content: Thought content normal.        Judgment: Judgment normal.     Imaging: MR BRAIN W WO CONTRAST  Result Date:  06/11/2022 CLINICAL DATA:  51 year old  female presenting with severe headache and vomiting, intra-axial hemorrhage in the posterior right hemisphere yesterday. EXAM: MRI HEAD WITHOUT AND WITH CONTRAST TECHNIQUE: Multiplanar, multiecho pulse sequences of the brain and surrounding structures were obtained without and with intravenous contrast. CONTRAST:  7.3 mL Vueway COMPARISON:  Head CT 0355 hours today.  CTA/CTV yesterday. FINDINGS: Brain: Oval T1 hypointense to isointense and T2 hyperintense intra-axial hematoma in the posterior right hemisphere just below the right parieto-occipital sulcus encompasses 30 x 30 by 26 mm by MRI (AP by transverse by CC), estimated at 12 mL as on CT. Blood products susceptibility on SWI imaging. Following contrast, no abnormal enhancement. Mild regional edema. But no significant mass effect. No intraventricular extension or ventriculomegaly. Trace subarachnoid hemorrhage better demonstrated by CT. No larger area of diffusion abnormality there, blood product related susceptibility on DWI. No midline shift, and basilar cisterns remain normal. Cervicomedullary junction and pituitary are within normal limits. No dural thickening or enhancement. No definite chronic cortical encephalomalacia or chronic cerebral blood products. Deep gray nuclei, brainstem and cerebellum appear negative. Vascular: Major intracranial vascular flow voids are preserved. The distal left vertebral artery appears dominant. Major dural venous sinuses are enhancing and appear to be patent. Skull and upper cervical spine: Negative. Visualized bone marrow signal is within normal limits. Sinuses/Orbits: Negative orbits. Paranasal Visualized paranasal sinuses and mastoids are clear. Other: Visible internal auditory structures appear normal. Negative visible scalp and face. IMPRESSION: 1. Superior right occipital lobe intra-axial hematoma, just caudal to the parieto-occipital sulcus is stable measuring 12 mL. Mild  regional edema with minimal associated mass effect. No evidence of underlying mass or lesion. Consider a follow-up MRI without and with contrast once the blood products have resolved. 2. Trace subarachnoid hemorrhage better demonstrated by CT. 3. Elsewhere negative MRI appearance of the brain. Electronically Signed   By: Odessa Fleming M.D.   On: 06/11/2022 08:24   CT HEAD WO CONTRAST ( )  Result Date: 06/11/2022 CLINICAL DATA:  51 year old female presenting with severe headache and vomiting, intra-axial hemorrhage in the posterior right hemisphere yesterday. Subsequent encounter. EXAM: CT HEAD WITHOUT CONTRAST TECHNIQUE: Contiguous axial images were obtained from the base of the skull through the vertex without intravenous contrast. RADIATION DOSE REDUCTION: This exam was performed according to the departmental dose-optimization program which includes automated exposure control, adjustment of the mA and/or kV according to patient size and/or use of iterative reconstruction technique. COMPARISON:  CT head, CTA and CTP yesterday. FINDINGS: Brain: Hyperdense intra-axial hemorrhage in the posterior right hemisphere centered near the right parieto-occipital sulcus encompasses 28 x 30 by 29  mm (AP by transverse by CC) versus 27 by 31 by 28 mm winds measured with the same technique yesterday. Extubated intra-axial volume is 12 mL, stable. Associated regional subarachnoid extension of blood is stable and relatively mild (sagittal image 21). Surrounding edema has not significantly changed. No intraventricular hemorrhage or ventriculomegaly. No significant intracranial mass effect and basilar cisterns remain normal. Stable gray-white matter differentiation elsewhere. Vascular: No suspicious intracranial vascular hyperdensity. Skull: Negative. Sinuses/Orbits: Visualized paranasal sinuses and mastoids are stable and well aerated. Other: No acute orbit or scalp soft tissue finding. IMPRESSION: 1. Stable intra-axial hemorrhage  (approximately 12 mL) in the posterior right hemisphere centered near the right parieto-occipital sulcus. Stable surrounding edema and mild regional subarachnoid extension of blood. 2. No significant intracranial mass effect and no new intracranial abnormality. Electronically Signed   By: Odessa Fleming M.D.   On: 06/11/2022 04:14   CT VENOGRAM HEAD  Result Date: 06/10/2022 CLINICAL DATA:  Intracranial hemorrhage EXAM: CT VENOGRAM HEAD TECHNIQUE: Venographic phase images of the brain were obtained following the administration of intravenous contrast. Multiplanar reformats and maximum intensity projections were generated. RADIATION DOSE REDUCTION: This exam was performed according to the departmental dose-optimization program which includes automated exposure control, adjustment of the mA and/or kV according to patient size and/or use of iterative reconstruction technique. CONTRAST:  6mL OMNIPAQUE IOHEXOL 350 MG/ML SOLN COMPARISON:  None Available. FINDINGS: Unchanged size of right occipital intraparenchymal hematoma. Superior sagittal sinus: Normal. Straight sinus: Normal. Inferior sagittal sinus, vein of Galen and internal cerebral veins: Normal. Transverse sinuses: Normal. Sigmoid sinuses: Normal. Visualized jugular veins: Normal. IMPRESSION: 1. No evidence of dural venous sinus thrombosis. 2. Unchanged size of right occipital intraparenchymal hematoma. Electronically Signed   By: Deatra Robinson M.D.   On: 06/10/2022 23:34   CT ANGIO HEAD NECK W WO CM  Result Date: 06/10/2022 CLINICAL DATA:  Syncopal event.  Severe headache and vomiting EXAM: CT ANGIOGRAPHY HEAD AND NECK TECHNIQUE: Multidetector CT imaging of the head and neck was performed using the standard protocol during bolus administration of intravenous contrast. Multiplanar CT image reconstructions and MIPs were obtained to evaluate the vascular anatomy. Carotid stenosis measurements (when applicable) are obtained utilizing NASCET criteria, using the  distal internal carotid diameter as the denominator. RADIATION DOSE REDUCTION: This exam was performed according to the departmental dose-optimization program which includes automated exposure control, adjustment of the mA and/or kV according to patient size and/or use of iterative reconstruction technique. CONTRAST:  58mL OMNIPAQUE IOHEXOL 350 MG/ML SOLN COMPARISON:  Head CT 06/10/2022 FINDINGS: Unchanged size of intraparenchymal hematoma centered in the right occipital lobe. CTA NECK FINDINGS SKELETON: There is no bony spinal canal stenosis. No lytic or blastic lesion. OTHER NECK: Normal pharynx, larynx and major salivary glands. No cervical lymphadenopathy. Unremarkable thyroid gland. UPPER CHEST: No pneumothorax or pleural effusion. No nodules or masses. AORTIC ARCH: There is no calcific atherosclerosis of the aortic arch. There is no aneurysm, dissection or hemodynamically significant stenosis of the visualized portion of the aorta. Conventional 3 vessel aortic branching pattern. The visualized proximal subclavian arteries are widely patent. RIGHT CAROTID SYSTEM: Normal without aneurysm, dissection or stenosis. LEFT CAROTID SYSTEM: Normal without aneurysm, dissection or stenosis. VERTEBRAL ARTERIES: Left dominant configuration. Both origins are clearly patent. There is no dissection, occlusion or flow-limiting stenosis to the skull base (V1-V3 segments). CTA HEAD FINDINGS POSTERIOR CIRCULATION: --Vertebral arteries: Normal V4 segments. --Inferior cerebellar arteries: Normal. --Basilar artery: Normal. --Superior cerebellar arteries: Normal. --Posterior cerebral arteries (PCA): Normal. ANTERIOR CIRCULATION: --Intracranial  internal carotid arteries: Normal. --Anterior cerebral arteries (ACA): Normal. Both A1 segments are present. Patent anterior communicating artery (a-comm). --Middle cerebral arteries (MCA): Normal. VENOUS SINUSES: As permitted by contrast timing, patent. ANATOMIC VARIANTS: None Review of the MIP  images confirms the above findings. IMPRESSION: 1. No emergent large vessel occlusion or high-grade stenosis of the intracranial arteries. 2. No aneurysm or vascular malformation. 3. Unchanged size of intraparenchymal hematoma centered in the right occipital lobe. Electronically Signed   By: Deatra RobinsonKevin  Herman M.D.   On: 06/10/2022 23:32   CT Head Wo Contrast  Addendum Date: 06/10/2022   ADDENDUM REPORT: 06/10/2022 22:25 ADDENDUM: These results were called by telephone at the time of interpretation on 06/10/2022 at 10:10 pm to provider Erma HeritageIsaacs, MD, who verbally acknowledged these results. Electronically Signed   By: Helyn NumbersAshesh  Parikh M.D.   On: 06/10/2022 22:25   Result Date: 06/10/2022 CLINICAL DATA:  Head trauma, moderate-severe; Facial trauma, blunt; Neck trauma, midline tenderness (Age 66-64y). Headache, vomiting. EXAM: CT HEAD WITHOUT CONTRAST CT MAXILLOFACIAL WITHOUT CONTRAST CT CERVICAL SPINE WITHOUT CONTRAST TECHNIQUE: Multidetector CT imaging of the head, cervical spine, and maxillofacial structures were performed using the standard protocol without intravenous contrast. Multiplanar CT image reconstructions of the cervical spine and maxillofacial structures were also generated. RADIATION DOSE REDUCTION: This exam was performed according to the departmental dose-optimization program which includes automated exposure control, adjustment of the mA and/or kV according to patient size and/or use of iterative reconstruction technique. COMPARISON:  None Available. FINDINGS: CT HEAD FINDINGS Brain: There is an acute intraparenchymal hematoma within the right occipital lobe measuring 2.1 x 2.8 x 2.7 cm (volume = 8.3 cm^3) with a small amount of surrounding cytotoxic edema. Small volume subarachnoid hemorrhage is seen within this region within the sulci of the right parietal and occipital lobes. Mild mass effect with effacement of the overlying sulci. No midline shift. Ventricular size is normal. Cerebellum is  unremarkable. Vascular: No hyperdense vessel or unexpected calcification. Skull: Normal. Negative for fracture or focal lesion. Other: Mastoid air cells and middle ear cavities are clear CT MAXILLOFACIAL FINDINGS Osseous: No fracture or mandibular dislocation. No destructive process. Orbits: Negative. No traumatic or inflammatory finding. Sinuses: Clear. Soft tissues: Mild soft tissue swelling superficial to the left zygomaticomaxillary suture. CT CERVICAL SPINE FINDINGS Alignment: Normal. Skull base and vertebrae: Craniocervical alignment is normal. The atlantodental interval is not widened. No acute fracture of the cervical spine. Vertebral body height is preserved. Soft tissues and spinal canal: No prevertebral fluid or swelling. No visible canal hematoma. Disc levels: Mild intervertebral disc space narrowing and endplate remodeling at C5-6 and C6-7 is present in keeping with changes of mild to moderate degenerative disc disease. Prevertebral soft tissues are not thickened on sagittal reformats. The spinal canal is widely patent. Asymmetric facet arthrosis results in severe right neuroforaminal narrowing at C3-4. Upper chest: Negative. Other: None IMPRESSION: 1. Acute intraparenchymal hematoma within the right occipital lobe with small volume surrounding cytotoxic edema and small volume subarachnoid hemorrhage. No midline shift. 2. No acute fracture or listhesis of the cervical spine. 3. No acute facial fracture. Mild soft tissue swelling superficial to the left zygomaticomaxillary suture. Electronically Signed: By: Helyn NumbersAshesh  Parikh M.D. On: 06/10/2022 22:04   CT Cervical Spine Wo Contrast  Addendum Date: 06/10/2022   ADDENDUM REPORT: 06/10/2022 22:25 ADDENDUM: These results were called by telephone at the time of interpretation on 06/10/2022 at 10:10 pm to provider Erma HeritageIsaacs, MD, who verbally acknowledged these results. Electronically Signed   By: Gloris HamAshesh  Ramiro Harvest M.D.   On: 06/10/2022 22:25   Result Date:  06/10/2022 CLINICAL DATA:  Head trauma, moderate-severe; Facial trauma, blunt; Neck trauma, midline tenderness (Age 12-64y). Headache, vomiting. EXAM: CT HEAD WITHOUT CONTRAST CT MAXILLOFACIAL WITHOUT CONTRAST CT CERVICAL SPINE WITHOUT CONTRAST TECHNIQUE: Multidetector CT imaging of the head, cervical spine, and maxillofacial structures were performed using the standard protocol without intravenous contrast. Multiplanar CT image reconstructions of the cervical spine and maxillofacial structures were also generated. RADIATION DOSE REDUCTION: This exam was performed according to the departmental dose-optimization program which includes automated exposure control, adjustment of the mA and/or kV according to patient size and/or use of iterative reconstruction technique. COMPARISON:  None Available. FINDINGS: CT HEAD FINDINGS Brain: There is an acute intraparenchymal hematoma within the right occipital lobe measuring 2.1 x 2.8 x 2.7 cm (volume = 8.3 cm^3) with a small amount of surrounding cytotoxic edema. Small volume subarachnoid hemorrhage is seen within this region within the sulci of the right parietal and occipital lobes. Mild mass effect with effacement of the overlying sulci. No midline shift. Ventricular size is normal. Cerebellum is unremarkable. Vascular: No hyperdense vessel or unexpected calcification. Skull: Normal. Negative for fracture or focal lesion. Other: Mastoid air cells and middle ear cavities are clear CT MAXILLOFACIAL FINDINGS Osseous: No fracture or mandibular dislocation. No destructive process. Orbits: Negative. No traumatic or inflammatory finding. Sinuses: Clear. Soft tissues: Mild soft tissue swelling superficial to the left zygomaticomaxillary suture. CT CERVICAL SPINE FINDINGS Alignment: Normal. Skull base and vertebrae: Craniocervical alignment is normal. The atlantodental interval is not widened. No acute fracture of the cervical spine. Vertebral body height is preserved. Soft tissues  and spinal canal: No prevertebral fluid or swelling. No visible canal hematoma. Disc levels: Mild intervertebral disc space narrowing and endplate remodeling at C5-6 and C6-7 is present in keeping with changes of mild to moderate degenerative disc disease. Prevertebral soft tissues are not thickened on sagittal reformats. The spinal canal is widely patent. Asymmetric facet arthrosis results in severe right neuroforaminal narrowing at C3-4. Upper chest: Negative. Other: None IMPRESSION: 1. Acute intraparenchymal hematoma within the right occipital lobe with small volume surrounding cytotoxic edema and small volume subarachnoid hemorrhage. No midline shift. 2. No acute fracture or listhesis of the cervical spine. 3. No acute facial fracture. Mild soft tissue swelling superficial to the left zygomaticomaxillary suture. Electronically Signed: By: Helyn Numbers M.D. On: 06/10/2022 22:04   CT Maxillofacial Wo Contrast  Addendum Date: 06/10/2022   ADDENDUM REPORT: 06/10/2022 22:25 ADDENDUM: These results were called by telephone at the time of interpretation on 06/10/2022 at 10:10 pm to provider Erma Heritage, MD, who verbally acknowledged these results. Electronically Signed   By: Helyn Numbers M.D.   On: 06/10/2022 22:25   Result Date: 06/10/2022 CLINICAL DATA:  Head trauma, moderate-severe; Facial trauma, blunt; Neck trauma, midline tenderness (Age 12-64y). Headache, vomiting. EXAM: CT HEAD WITHOUT CONTRAST CT MAXILLOFACIAL WITHOUT CONTRAST CT CERVICAL SPINE WITHOUT CONTRAST TECHNIQUE: Multidetector CT imaging of the head, cervical spine, and maxillofacial structures were performed using the standard protocol without intravenous contrast. Multiplanar CT image reconstructions of the cervical spine and maxillofacial structures were also generated. RADIATION DOSE REDUCTION: This exam was performed according to the departmental dose-optimization program which includes automated exposure control, adjustment of the mA and/or  kV according to patient size and/or use of iterative reconstruction technique. COMPARISON:  None Available. FINDINGS: CT HEAD FINDINGS Brain: There is an acute intraparenchymal hematoma within the right occipital lobe measuring 2.1 x 2.8 x 2.7  cm (volume = 8.3 cm^3) with a small amount of surrounding cytotoxic edema. Small volume subarachnoid hemorrhage is seen within this region within the sulci of the right parietal and occipital lobes. Mild mass effect with effacement of the overlying sulci. No midline shift. Ventricular size is normal. Cerebellum is unremarkable. Vascular: No hyperdense vessel or unexpected calcification. Skull: Normal. Negative for fracture or focal lesion. Other: Mastoid air cells and middle ear cavities are clear CT MAXILLOFACIAL FINDINGS Osseous: No fracture or mandibular dislocation. No destructive process. Orbits: Negative. No traumatic or inflammatory finding. Sinuses: Clear. Soft tissues: Mild soft tissue swelling superficial to the left zygomaticomaxillary suture. CT CERVICAL SPINE FINDINGS Alignment: Normal. Skull base and vertebrae: Craniocervical alignment is normal. The atlantodental interval is not widened. No acute fracture of the cervical spine. Vertebral body height is preserved. Soft tissues and spinal canal: No prevertebral fluid or swelling. No visible canal hematoma. Disc levels: Mild intervertebral disc space narrowing and endplate remodeling at C5-6 and C6-7 is present in keeping with changes of mild to moderate degenerative disc disease. Prevertebral soft tissues are not thickened on sagittal reformats. The spinal canal is widely patent. Asymmetric facet arthrosis results in severe right neuroforaminal narrowing at C3-4. Upper chest: Negative. Other: None IMPRESSION: 1. Acute intraparenchymal hematoma within the right occipital lobe with small volume surrounding cytotoxic edema and small volume subarachnoid hemorrhage. No midline shift. 2. No acute fracture or listhesis  of the cervical spine. 3. No acute facial fracture. Mild soft tissue swelling superficial to the left zygomaticomaxillary suture. Electronically Signed: By: Helyn Numbers M.D. On: 06/10/2022 22:04   DG Chest Port 1 View  Result Date: 06/10/2022 CLINICAL DATA:  Syncope, intracranial hemorrhage EXAM: PORTABLE CHEST 1 VIEW COMPARISON:  None Available. FINDINGS: The heart size and mediastinal contours are within normal limits. Both lungs are clear. The visualized skeletal structures are unremarkable. IMPRESSION: No active disease. Electronically Signed   By: Helyn Numbers M.D.   On: 06/10/2022 22:13    Labs:  CBC: Recent Labs    06/10/22 2137  WBC 13.3*  HGB 12.0  HCT 37.4  PLT 326    COAGS: Recent Labs    06/10/22 2341  INR 1.1    BMP: Recent Labs    06/10/22 2137  NA 138  K 3.7  CL 104  CO2 22  GLUCOSE 153*  BUN 16  CALCIUM 9.3  CREATININE 0.65  GFRNONAA >60    LIVER FUNCTION TESTS: Recent Labs    06/10/22 2137  BILITOT 0.7  AST 23  ALT 18  ALKPHOS 62  PROT 8.3*  ALBUMIN 4.8    TUMOR MARKERS: No results for input(s): "AFPTM", "CEA", "CA199", "CHROMGRNA" in the last 8760 hours.  Assessment and Plan:  Acute severe headache = CT scan showed  acute intraparenchymal hematoma within the right occipital lobe with small volume cytotoxic edema and small volume subarachnoid hemorrhage.   Images and chart reviewed and patient seen and examined by Dr. Loni Beckwith.  Will proceed with diagnostic cerebral angiography tomorrow by Dr. Corliss Skains.  Risks and benefits of cerebral angiogram with intervention were discussed with the patient including, but not limited to bleeding, infection, vascular injury, contrast induced renal failure, stroke or even death.  This interventional procedure involves the use of X-rays and because of the nature of the planned procedure, it is possible that we will have prolonged use of X-ray fluoroscopy.  Potential radiation risks to you  include (but are not limited to) the following: - A slightly elevated  risk for cancer  several years later in life. This risk is typically less than 0.5% percent. This risk is low in comparison to the normal incidence of human cancer, which is 33% for women and 50% for men according to the [employer]. - Radiation induced injury can include skin redness, resembling a rash, tissue breakdown / ulcers and hair loss (which can be temporary or permanent).   The likelihood of either of these occurring depends on the difficulty of the procedure and whether you are sensitive to radiation due to previous procedures, disease, or genetic conditions.   IF your procedure requires a prolonged use of radiation, you will be notified and given written instructions for further action.  It is your responsibility to monitor the irradiated area for the 2 weeks following the procedure and to notify your physician if you are concerned that you have suffered a radiation induced injury.    All of the patient's questions were answered, patient is agreeable to proceed.  Consent signed and in chart.  Thank you for allowing our service to participate in Shelley Harper 's care.  Electronically Signed: Gwynneth Macleod, PA-C   06/11/2022, 10:44 AM      I spent a total of 40 Minutes  in face to face in clinical consultation, greater than 50% of which was counseling/coordinating care for cerebral angiography.

## 2022-06-11 NOTE — ED Notes (Signed)
MD made aware of patient's BP  

## 2022-06-11 NOTE — ED Triage Notes (Signed)
Pt BIB [facility] from [facility]. Pt at gym around 1730 yesterday had a constant HA and then fell. Imaging at previous facility showed subdural hematoma to the occipital lobe. Pt presents to ED with constant HA and light sensitivity. Patient is currently on Cardene @5mg /hr

## 2022-06-11 NOTE — Evaluation (Signed)
Speech Language Pathology Evaluation Patient Details Name: Shelley Harper MRN: [id number] DOB: February 27, 1971 Today's Date: 06/11/2022 Time: 9528-4132 SLP Time Calculation (min) (ACUTE ONLY): 18 min  Problem List:  Patient Active Problem List   Diagnosis Date Noted   ICH (intracerebral hemorrhage) ([place]) 06/11/2022   Past Medical History:  Past Medical History:  Diagnosis Date   No known health problems    Past Surgical History:  Past Surgical History:  Procedure Laterality Date   NO PAST SURGERIES     HPI:  Pt is a 51 y.o. female with no significant pmh who presented to ED with sudden onset headache on 06/10/2022 while exercising. CT head demonstrated acute intraparenchymal hematoma within the right occipital lobe with small volume cytotoxic edema and small volume subarachnoid hemorrhage.   Assessment / Plan / Recommendation Clinical Impression   Pt seen for cognitive linguistic evaluation. She presents as significantly lethargic but was able to rouse enough for participation in session provided physical stimulation. Pt received Dilaudid earlier in the day, which could be contributing to lethargic state. Her speech intelligibility was considerably reduced by low vocal intensity. Pt reports of changes in cognition were unintelligible, however, husband reports her cognition has improved from prior day. The pt participated in the [facility] evaluation to further assess level of cognitive linguistic functioning. 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 Pt displayed issues with sustained attention across evaluation and required maximal cueing to attend to task. Additionally, pt presents with a deficit in short term memory, as she was unable to independently recall all items heard prior in session or a sequence of numbers. No deficits in problem solving, verbal comprehension, orientation, and visual discrimination noted. Suspect pt's  lethargic state impacted results of the assessment. Husband reports that the pt does not control household finances. Due to pt's recent change in baseline cognition, SLP will f/u for cognitive treatment.     SLP Assessment  SLP Recommendation/Assessment: Patient needs continued Speech [place] [facility] SLP Visit Diagnosis: Cognitive communication deficit (R41.841)    Recommendations for follow up therapy are one component of a multi-disciplinary discharge planning process, led by the attending physician.  Recommendations may be updated based on patient status, additional functional criteria and insurance authorization.    Follow Up Recommendations  Follow physician's recommendations for discharge plan and follow up therapies    Assistance Recommended at Discharge  PRN  Functional Status Assessment Patient has had a recent decline in their functional status and demonstrates the ability to make significant improvements in function in a reasonable and predictable amount of time.  Frequency and Duration min 2x/week  2 weeks      SLP Evaluation Cognition  Overall Cognitive Status: Impaired/Different from baseline Arousal/Alertness: Suspect due to medications (had Dilaudid this am) Orientation Level: Oriented to person;Oriented to time;Oriented to place Year: 2023 Month: October Day of Week: Correct Attention: Focused;Sustained Focused Attention: Impaired Focused Attention Impairment: Verbal basic Sustained Attention: Impaired Sustained Attention Impairment: Verbal basic Memory: Impaired Memory Impairment: Decreased short term memory Decreased Short Term Memory: Verbal basic Awareness: Appears intact Problem Solving: Appears intact Behaviors: Perseveration       Comprehension  Auditory Comprehension Overall Auditory Comprehension: Appears within functional limits for tasks assessed Yes/No Questions: Not tested Commands: Within Functional Limits Conversation:  Simple Interfering Components: Attention EffectiveTechniques: Stressing words Visual Recognition/Discrimination Discrimination: Not tested Reading Comprehension Reading Status: Not tested    Expression Expression Primary Mode of Expression: Verbal Verbal Expression Overall  Verbal Expression: Appears within functional limits for tasks assessed Initiation: No impairment Level of Generative/Spontaneous Verbalization: Word Repetition: No impairment Naming: Impairment Responsive: Not tested Confrontation: Not tested Convergent: Not tested Divergent: 50-74% accurate Pragmatics: Unable to assess Interfering Components: Attention Written Expression Dominant Hand: Right Written Expression: Not tested   Oral / Motor  Motor Speech Overall Motor Speech: Appears within functional limits for tasks assessed Respiration: Within functional limits Phonation: Low vocal intensity Resonance: Within functional limits Articulation: Within functional limitis Intelligibility: Intelligibility reduced Word: 75-100% accurate Phrase: 50-74% accurate Sentence: 50-74% accurate Conversation: Not tested Motor Planning: Not tested Motor Speech Errors: Not applicable            [employer] Student SLP  06/11/2022, 12:43 PM

## 2022-06-12 ENCOUNTER — Inpatient Hospital Stay (HOSPITAL_COMMUNITY): Payer: Managed Care, Other (non HMO)

## 2022-06-12 DIAGNOSIS — I610 Nontraumatic intracerebral hemorrhage in hemisphere, subcortical: Secondary | ICD-10-CM | POA: Diagnosis not present

## 2022-06-12 HISTORY — PX: IR ANGIO VERTEBRAL SEL VERTEBRAL BILAT MOD SED: IMG5369

## 2022-06-12 HISTORY — PX: IR ANGIO INTRA EXTRACRAN SEL INTERNAL CAROTID BILAT MOD SED: IMG5363

## 2022-06-12 LAB — HEMOGLOBIN A1C
Hgb A1c MFr Bld: 5.8 % — ABNORMAL HIGH (ref 4.8–5.6)
Mean Plasma Glucose: 119.76 mg/dL

## 2022-06-12 LAB — LIPID PANEL
Cholesterol: 151 mg/dL (ref 0–200)
HDL: 54 mg/dL (ref >40)
LDL Cholesterol: 82 mg/dL (ref 0–99)
Total CHOL/HDL Ratio: 2.8 RATIO
Triglycerides: 77 mg/dL (ref <150)
VLDL: 15 mg/dL (ref 0–40)

## 2022-06-12 LAB — SEDIMENTATION RATE: Sed Rate: 26 mm/hr — ABNORMAL HIGH (ref 0–22)

## 2022-06-12 LAB — C-REACTIVE PROTEIN: CRP: 0.7 mg/dL (ref <1.0)

## 2022-06-12 MED ORDER — MIDAZOLAM HCL 2 MG/2ML IJ SOLN
INTRAMUSCULAR | Status: AC
  Filled 2022-06-12: qty 2

## 2022-06-12 MED ORDER — FENTANYL CITRATE (PF) 100 MCG/2ML IJ SOLN
INTRAMUSCULAR | Status: AC
  Filled 2022-06-12: qty 2

## 2022-06-12 MED ORDER — LIDOCAINE HCL 1 % IJ SOLN
INTRAMUSCULAR | Status: AC
  Filled 2022-06-12: qty 20

## 2022-06-12 MED ORDER — SODIUM CHLORIDE 0.9 % IV SOLN
12.5000 mg | Freq: Four times a day (QID) | INTRAVENOUS | Status: DC | PRN
  Administered 2022-06-12: 12.5 mg via INTRAVENOUS
  Filled 2022-06-12 (×2): qty 0.5

## 2022-06-12 MED ORDER — IOHEXOL 300 MG/ML  SOLN
100.0000 mL | Freq: Once | INTRAMUSCULAR | Status: AC | PRN
  Administered 2022-06-12: 25 mL via INTRA_ARTERIAL

## 2022-06-12 MED ORDER — MIDAZOLAM HCL 2 MG/2ML IJ SOLN
INTRAMUSCULAR | Status: AC | PRN
  Administered 2022-06-12: .5 mg via INTRAVENOUS

## 2022-06-12 MED ORDER — HEPARIN SODIUM (PORCINE) 1000 UNIT/ML IJ SOLN
INTRAMUSCULAR | Status: AC | PRN
  Administered 2022-06-12: 1000 [IU] via INTRAVENOUS

## 2022-06-12 MED ORDER — LIDOCAINE HCL (PF) 1 % IJ SOLN
INTRAMUSCULAR | Status: AC | PRN
  Administered 2022-06-12: 8 mL

## 2022-06-12 MED ORDER — ORAL CARE MOUTH RINSE: 15.0000 mL | OROMUCOSAL | Status: DC | PRN

## 2022-06-12 MED ORDER — PROMETHAZINE HCL 25 MG PO TABS: 12.5000 mg | ORAL_TABLET | Freq: Four times a day (QID) | ORAL | Status: DC | PRN

## 2022-06-12 MED ORDER — SODIUM CHLORIDE 0.9 % IV SOLN: INTRAVENOUS | Status: AC

## 2022-06-12 MED ORDER — PROMETHAZINE HCL 12.5 MG RE SUPP: 12.5000 mg | Freq: Four times a day (QID) | RECTAL | Status: DC | PRN

## 2022-06-12 MED ORDER — FENTANYL CITRATE (PF) 100 MCG/2ML IJ SOLN
INTRAMUSCULAR | Status: AC | PRN
  Administered 2022-06-12: 25 ug via INTRAVENOUS

## 2022-06-12 MED ORDER — IOHEXOL 300 MG/ML  SOLN
100.0000 mL | Freq: Once | INTRAMUSCULAR | Status: AC | PRN
  Administered 2022-06-12: 75 mL via INTRA_ARTERIAL

## 2022-06-12 MED ORDER — HEPARIN SODIUM (PORCINE) 1000 UNIT/ML IJ SOLN
INTRAMUSCULAR | Status: AC
  Filled 2022-06-12: qty 2

## 2022-06-12 NOTE — Progress Notes (Signed)
OT Cancellation Note  Patient Details Name: Skylynne Schlechter MRN: [id number] DOB: [birth year]   Cancelled Treatment:    Reason Eval/Treat Not Completed: Patient at procedure or test/ unavailable  Jeri Modena 06/12/2022, 12:14 PM

## 2022-06-12 NOTE — Progress Notes (Addendum)
STROKE TEAM PROGRESS NOTE   INTERVAL HISTORY Family friends were by bedside. Patient was seen in bed this AM. Patient reports improving headache but still present. Persistent nausea on exam, she vomited during the assessment.   Cerebral angiogram scheduled for later today   Vitals:   06/12/22 0600 06/12/22 0630 06/12/22 0700 06/12/22 0800  BP: (!) 164/86 115/62 (!) 121/55 121/60  Pulse: (!) 57 75 76 68  Resp: 15 14 15 16   Temp:    (!) 96.8 F (36 C)  TempSrc:    Axillary  SpO2: 98% 97% 97% 96%  Weight:      Height:       CBC:  Recent Labs  Lab 06/10/22 2137  WBC 13.3*  NEUTROABS 10.9*  HGB 12.0  HCT 37.4  MCV 86.8  PLT 622   Basic Metabolic Panel:  Recent Labs  Lab 06/10/22 2137  NA 138  K 3.7  CL 104  CO2 22  GLUCOSE 153*  BUN 16  CREATININE 0.65  CALCIUM 9.3   Lipid Panel:  Recent Labs  Lab 06/12/22 0257  CHOL 151  TRIG 77  HDL 54  CHOLHDL 2.8  VLDL 15  LDLCALC 82   HgbA1c:  Recent Labs  Lab 06/12/22 0257  HGBA1C 5.8*   Urine Drug Screen:  Recent Labs  Lab 06/11/22 1031  LABOPIA POSITIVE*  COCAINSCRNUR NONE DETECTED  LABBENZ NONE DETECTED  AMPHETMU NONE DETECTED  THCU NONE DETECTED  LABBARB NONE DETECTED    Alcohol Level No results for input(s): "ETH" in the last 168 hours.  IMAGING past 24 hours ECHOCARDIOGRAM COMPLETE  Result Date: 06/11/2022    ECHOCARDIOGRAM REPORT   Patient Name:   Shelley Harper Date of Exam: 06/11/2022 Medical Rec #:  [id number]                 Height:       63.0 in Accession #:    [id number]                Weight:       160.0 lb Date of Birth:  05/21/1971                 BSA:          1.759 m Patient Age:    9 years                  BP:           128/79 mmHg Patient Gender: F                         HR:           56 bpm. Exam Location:  Inpatient Procedure: 2D Echo, Cardiac Doppler and Color Doppler Indications:    Stroke I63.9  History:        Patient has no prior history of Echocardiogram  examinations. No                 previous cardiac history.  Sonographer:    Darlina Sicilian RDCS Referring Phys: [id number] [place]  1. Left ventricular ejection fraction, by estimation, is 60 to 65%. The left ventricle has normal function. The left ventricle has no regional wall motion abnormalities. Left ventricular diastolic parameters were normal.  2. Right ventricular systolic function is normal. The right ventricular size is normal. Tricuspid regurgitation signal is inadequate for assessing PA pressure.  3. The mitral valve is normal  in structure. No evidence of mitral valve regurgitation. No evidence of mitral stenosis.  4. The aortic valve is normal in structure. Aortic valve regurgitation is not visualized. No aortic stenosis is present.  5. The inferior vena cava is normal in size with <50% respiratory variability, suggesting right atrial pressure of 8 mmHg. Conclusion(s)/Recommendation(s): Otherwise normal echocardiogram, with minor abnormalities described in the report. FINDINGS  Left Ventricle: Left ventricular ejection fraction, by estimation, is 60 to 65%. The left ventricle has normal function. The left ventricle has no regional wall motion abnormalities. The left ventricular internal cavity size was normal in size. There is  no left ventricular hypertrophy. Left ventricular diastolic parameters were normal. Right Ventricle: The right ventricular size is normal. No increase in right ventricular wall thickness. Right ventricular systolic function is normal. Tricuspid regurgitation signal is inadequate for assessing PA pressure. Left Atrium: Left atrial size was normal in size. Right Atrium: Right atrial size was normal in size. Pericardium: There is no evidence of pericardial effusion. Presence of epicardial fat layer. Mitral Valve: The mitral valve is normal in structure. No evidence of mitral valve regurgitation. No evidence of mitral valve stenosis. Tricuspid Valve: The tricuspid  valve is normal in structure. Tricuspid valve regurgitation is trivial. No evidence of tricuspid stenosis. Aortic Valve: The aortic valve is normal in structure. Aortic valve regurgitation is not visualized. No aortic stenosis is present. Pulmonic Valve: The pulmonic valve was normal in structure. Pulmonic valve regurgitation is not visualized. No evidence of pulmonic stenosis. Aorta: The aortic root, ascending aorta and aortic arch are all structurally normal, with no evidence of dilitation or obstruction. Venous: The inferior vena cava is normal in size with less than 50% respiratory variability, suggesting right atrial pressure of 8 mmHg. IAS/Shunts: No atrial level shunt detected by color flow Doppler.  LEFT VENTRICLE PLAX 2D LVIDd:         4.40 cm   Diastology LVIDs:         2.70 cm   LV e' medial:    9.79 cm/s LV PW:         0.80 cm   LV E/e' medial:  11.0 LV IVS:        0.90 cm   LV e' lateral:   11.40 cm/s LVOT diam:     1.90 cm   LV E/e' lateral: 9.5 LV SV:         66 LV SV Index:   38 LVOT Area:     2.84 cm  RIGHT VENTRICLE RV S prime:     19.30 cm/s LEFT ATRIUM             Index        RIGHT ATRIUM           Index LA diam:        3.10 cm 1.76 cm/m   RA Area:     13.30 cm LA Vol (A2C):   40.7 ml 23.14 ml/m  RA Volume:   26.80 ml  15.24 ml/m LA Vol (A4C):   35.3 ml 20.07 ml/m LA Biplane Vol: 38.7 ml 22.00 ml/m  AORTIC VALVE LVOT Vmax:   123.00 cm/s LVOT Vmean:  84.000 cm/s LVOT VTI:    0.234 m  AORTA Ao Root diam: 2.60 cm Ao Asc diam:  2.50 cm MITRAL VALVE MV Area (PHT): 3.89 cm     SHUNTS MV Decel Time: 195 msec     Systemic VTI:  0.23 m MV E velocity: 108.00 cm/s  Systemic Diam: 1.90 cm MV A velocity: 59.70 cm/s MV E/A ratio:  1.81 Riley Lam MD Electronically signed by Riley Lam MD Signature Date/Time: 06/11/2022/2:40:13 PM    Final     PHYSICAL EXAM General: Pleasant, well-appearing in bed. No acute distress. Extremities: Normal ROM. 5/5 strength in RUE, LUE, RLE, and  LLE Skin: Warm and dry. No obvious rash or lesions. Neuro: A&Ox3. CN II-XII intact. No cerebellar abnormalities. Moves all extremities. Normal sensation. No focal deficit. No dysarthria. Psych: Normal mood and affect    ASSESSMENT/PLAN Shelley Harper is a 51 y.o. female with no significant past medical history who reports sudden onset thunderclap headache at 1730 on 06/10/2022 while she was at the gym exercising.   Right occipital lobe ICH with ICH score of 0 Etiology:  possible RCVS  CTH was negative for a large hypodensity concerning for a large territory infarct or hyperdensity concerning for an ICH CTA head & neck no LVO or high-grade stenosis of the intracranial arteries Cerebral angio scheduled today MRI  Superior right occipital lobe intra-axial hematoma, just caudal to the parieto-occipital sulcus is stable measuring 12 mL. Mild regional edema with minimal associated mass effect. No evidence of underlying mass or lesion. 2D Echo EF 60-65% LDL 82 HgbA1c 5.8 VTE prophylaxis - lovenox    Diet   Diet NPO time specified   No anticoagulation indicated at this time due to intracranial bleeding Start IV Zofran, Topamax Therapy recommendations:  pending Disposition:  home FMLA paperwork filled out  Hypertension Home meds:  none Stable Permissive hypertension (OK if < 220/120) but gradually normalize in 5-7 days Long-term BP goal normotensive Continue IV hydralazine 20mg  Q6H PRN Start Phenergan 12.5mg  Q6H PRN for nausea/vomiting IVNS 108mL/hr    Prediabetes Home meds:  none HgbA1c 5.8, goal < 7.0  Other Stroke Risk Factors Family hx stroke (father)  Other Active Problems none  Hospital day # 1  72m, MD PGY-1 Psychiatry I have personally obtained history,examined this patient, reviewed notes, independently viewed imaging studies, participated in medical decision making and plan of care.ROS completed by me personally and pertinent positives fully  documented  I have made any additions or clarifications directly to the above note. Agree with note above.  Patient still having persistent headache we will continue Topamax and Tylenol 3 as needed.  Check cerebral catheter angiogram today.  Check lab work for systemic vasculitis though history is not suggestive of that.  Failed outpatient for patient's FMLA.  Long discussion with patient and friends at the bedside and answered questions.  Discussed with Dr. Christia Reading.This patient is critically ill and at significant risk of neurological worsening, death and care requires constant monitoring of vital signs, hemodynamics,respiratory and cardiac monitoring, extensive review of multiple databases, frequent neurological assessment, discussion with family, other specialists and medical decision making of high complexity.I have made any additions or clarifications directly to the above note.This critical care time does not reflect procedure time, or teaching time or supervisory time of PA/NP/Med Resident etc but could involve care discussion time.  I spent 30 minutes of neurocritical care time  in the care of  this patient.      Corliss Skains, MD Medical Director [facility] Pager: [phone] 06/12/2022 5:16 PM   To contact Stroke Continuity provider, please refer to 08/12/2022. After hours, contact General Neurology

## 2022-06-12 NOTE — Procedures (Signed)
INR. Status post four-vessel cerebral arteriogram. Right CFA approach. Findings. 1.  There are smooth focal areas of moderate narrowing involving the right anterior cerebral artery pericallosal branch, and posterior parietal branch of the left middle cerebral artery. 2.  No gross abnormalities of venous outflow noted. 3.  Study partially marred by motion artifact related to respiration.  Arlean Hopping MD

## 2022-06-12 NOTE — Progress Notes (Signed)
PT Cancellation Note  Patient Details Name: Shelley Harper MRN: [id number] DOB: 29-Mar-1971   Cancelled Treatment:    Reason Eval/Treat Not Completed: Patient at procedure or test/unavailable. Will plan to follow-up later as time permits.   Moishe Spice, PT, DPT Acute Rehabilitation Services  Office: [place] 06/12/2022, 12:14 PM

## 2022-06-13 ENCOUNTER — Inpatient Hospital Stay (HOSPITAL_COMMUNITY): Payer: Managed Care, Other (non HMO)

## 2022-06-13 DIAGNOSIS — I610 Nontraumatic intracerebral hemorrhage in hemisphere, subcortical: Secondary | ICD-10-CM | POA: Diagnosis not present

## 2022-06-13 LAB — SODIUM
Sodium: 136 mmol/L (ref 135–145)
Sodium: 139 mmol/L (ref 135–145)

## 2022-06-13 MED ORDER — SODIUM CHLORIDE 3 % IV SOLN
INTRAVENOUS | Status: DC
  Filled 2022-06-13 (×3): qty 500

## 2022-06-13 MED ORDER — TOPIRAMATE 25 MG PO TABS
50.0000 mg | ORAL_TABLET | Freq: Two times a day (BID) | ORAL | Status: DC
  Administered 2022-06-13 – 2022-06-15 (×4): 50 mg via ORAL
  Filled 2022-06-13 (×4): qty 2

## 2022-06-13 MED ORDER — PANTOPRAZOLE SODIUM 40 MG PO TBEC: 40.0000 mg | DELAYED_RELEASE_TABLET | Freq: Every day | ORAL | 0 refills | Status: DC

## 2022-06-13 MED ORDER — VERAPAMIL HCL ER 120 MG PO TBCR
120.0000 mg | EXTENDED_RELEASE_TABLET | Freq: Every day | ORAL | Status: DC
  Administered 2022-06-13 – 2022-06-14 (×2): 120 mg via ORAL
  Filled 2022-06-13 (×2): qty 1

## 2022-06-13 MED ORDER — TOPIRAMATE 25 MG PO TABS: 25.0000 mg | ORAL_TABLET | Freq: Two times a day (BID) | ORAL | 0 refills | Status: DC

## 2022-06-13 MED ORDER — ACETAMINOPHEN-CODEINE 300-30 MG PO TABS
1.0000 | ORAL_TABLET | Freq: Four times a day (QID) | ORAL | Status: DC | PRN
  Administered 2022-06-13: 1 via ORAL
  Filled 2022-06-13: qty 1

## 2022-06-13 MED ORDER — ONDANSETRON HCL 4 MG PO TABS: 4.0000 mg | ORAL_TABLET | Freq: Three times a day (TID) | ORAL | 0 refills | Status: DC | PRN

## 2022-06-13 MED ORDER — TRAMADOL HCL 50 MG PO TABS
50.0000 mg | ORAL_TABLET | Freq: Four times a day (QID) | ORAL | Status: DC | PRN
  Administered 2022-06-13 – 2022-06-14 (×2): 50 mg via ORAL
  Filled 2022-06-13 (×2): qty 1

## 2022-06-13 MED ORDER — AMLODIPINE BESYLATE 5 MG PO TABS
5.0000 mg | ORAL_TABLET | Freq: Every day | ORAL | Status: DC
  Administered 2022-06-13: 5 mg via ORAL
  Filled 2022-06-13: qty 1

## 2022-06-13 NOTE — Evaluation (Addendum)
Physical Therapy Evaluation Patient Details Name: Assata Juncaj MRN: [id number] DOB: 1971-07-29 Today's Date: 06/13/2022  History of Present Illness  Rasheda Rabab Currington is a 51 y.o. female who reports sudden onset thunderclap headache on 06/10/2022 while she was at the gym exercising. CT head demonstrated acute intraparenchymal hematoma within the right occipital lobe with small volume cytotoxic edema and small volume subarachnoid hemorrhage. No significant past medical hx.   Clinical Impression  Pt presents with condition above and deficits mentioned below, see PT Problem List. PTA, she was IND without DME, living with her husband and elderly parents in a 2-level house with 1 STE and 10 stairs to access her bedroom. Currently, pt is demonstrating fairly symmetrical and intact strength and sensation in her bil lower extremities. However, she is limited by HTN and dizziness with changes in position, resulting in noted balance deficits. As a result, pt required light minA HHA for standing balance and to take a few steps at EOB today. Limited session to just stepping to [facility] due to her BP increasing outside her goal SBP range. Notified RN and MD. Will continue to follow acutely. Anticipating pt will progress well once her BP and dizziness are more under control. Thus, at this time, recommending OPPT if pt has the available transportation to get to her appointments, if not then recommending HHPT.  BP: 145/72 (94) supine 184/91 (118) once sitting EOB 169/88 (109) with repeat BP in sitting 180/92 (119) once supine end of session     Recommendations for follow up therapy are one component of a multi-disciplinary discharge planning process, led by the attending physician.  Recommendations may be updated based on patient status, additional functional criteria and insurance authorization.  Follow Up Recommendations Outpatient PT (vs HHPT pending transportation availability)       Assistance Recommended at Discharge Intermittent Supervision/Assistance  Patient can return home with the following  A little help with walking and/or transfers;A little help with bathing/dressing/bathroom;Assistance with cooking/housework;Assist for transportation;Help with stairs or ramp for entrance    Equipment Recommendations Other (comment) (TBA further)  Recommendations for Other Services       Functional Status Assessment Patient has had a recent decline in their functional status and demonstrates the ability to make significant improvements in function in a reasonable and predictable amount of time.     Precautions / Restrictions Precautions Precautions: Fall Precaution Comments: dizzy with sitting up and standing (HTN); SBP goal 130-150 Restrictions Weight Bearing Restrictions: No      Mobility  Bed Mobility Overal bed mobility: Modified Independent             General bed mobility comments: increased time; once at EOB she sat for at least one minute doing ankle pumps due to c/o mild dizziness (BP up, not down)    Transfers Overall transfer level: Needs assistance Equipment used: 2 person hand held assist Transfers: Sit to/from Stand Sit to Stand: Min assist, +2 safety/equipment           General transfer comment: Pt requesting HHA due to feeling dizzy with initial attempt. Light minA for stability due to noted trunk sway and reported dizziness upon standing.    Ambulation/Gait Ambulation/Gait assistance: Min assist, +2 safety/equipment Gait Distance (Feet): 3 Feet Assistive device: 2 person hand held assist Gait Pattern/deviations: Step-through pattern, Decreased stride length Gait velocity: reduced Gait velocity interpretation: <1.31 ft/sec, indicative of household ambulator   General Gait Details: Pt with small lateral steps at EOB to get to [facility] to  return to supine, limited due to elevated BP. Trunk sway and dizziness noted, light minA HHA for  stability  Stairs            Wheelchair Mobility    Modified Rankin (Stroke Patients Only) Modified Rankin (Stroke Patients Only) Pre-Morbid Rankin Score: No symptoms Modified Rankin: Moderately severe disability     Balance Overall balance assessment: Needs assistance Sitting-balance support: No upper extremity supported, Feet supported Sitting balance-Leahy Scale: Good Sitting balance - Comments: able to cross legs and donn socks without LOB while seated EOB   Standing balance support: Bilateral upper extremity supported Standing balance-Leahy Scale: Poor Standing balance comment: Reliant on UE support                             Pertinent Vitals/Pain Pain Assessment Pain Assessment: No/denies pain    Home Living Family/patient expects to be discharged to:: Private residence Living Arrangements: Spouse/significant other;Parent Available Help at Discharge: Family;Available 24 hours/day (husband works; parents home all day) Type of Home: House Home Access: Stairs to enter Entrance Stairs-Rails: None Entrance Stairs-Number of Steps: 1 Alternate Level Stairs-Number of Steps: 10 Home Layout: Two level;Able to live on main level with bedroom/bathroom (her bedroom is typically upstairs though) Home Equipment: Grab bars - tub/shower (grab bars in shower downstairs only)      Prior Function Prior Level of Function : Independent/Modified Independent;Driving;Working/employed               ADLs Comments: Works in [employer]. Wears glasses at all times.     Hand Dominance   Dominant Hand: Right    Extremity/Trunk Assessment   Upper Extremity Assessment Upper Extremity Assessment: Defer to OT evaluation LUE Deficits / Details: did not some proprioception issues with grabbing sock with left hand when donning both socks (pt reports no numbness or tingling)    Lower Extremity Assessment Lower Extremity Assessment: Overall WFL for tasks assessed (MMT  scores of 4+ to 5 grossly bil; denies numbness/tingling bil)    Cervical / Trunk Assessment Cervical / Trunk Assessment: Normal  Communication   Communication: No difficulties  Cognition Arousal/Alertness: Awake/alert Behavior During Therapy: WFL for tasks assessed/performed Overall Cognitive Status: Within Functional Limits for tasks assessed                                 General Comments: Follows commands appropriately, maybe slightly delayed but able to identify her dizziness        General Comments General comments (skin integrity, edema, etc.): 145/72 (94) supine, elevated to 184/91 (118) once sitting EOB, stayed at 169/88 (109) with repeat BP in sitting, and stayed at 180/92 (119) once supine end of session; dizziness worsened with standing    Exercises     Assessment/Plan    PT Assessment Patient needs continued PT services  PT Problem List Decreased activity tolerance;Decreased balance;Decreased mobility;Cardiopulmonary status limiting activity       PT Treatment Interventions DME instruction;Gait training;Stair training;Functional mobility training;Therapeutic activities;Balance training;Therapeutic exercise;Neuromuscular re-education;Cognitive remediation;Patient/family education    PT Goals (Current goals can be found in the Care Plan section)  Acute Rehab PT Goals Patient Stated Goal: to get back to normal PT Goal Formulation: With patient/family Time For Goal Achievement: 06/27/22 Potential to Achieve Goals: Good    Frequency Min 4X/week     Co-evaluation PT/OT/SLP Co-Evaluation/Treatment: Yes Reason for Co-Treatment: For patient/therapist safety;To address  functional/ADL transfers PT goals addressed during session: Mobility/safety with mobility;Balance OT goals addressed during session: ADL's and self-care;Strengthening/ROM       AM-PAC PT "6 Clicks" Mobility  Outcome Measure Help needed turning from your back to your side while in a  flat bed without using bedrails?: None Help needed moving from lying on your back to sitting on the side of a flat bed without using bedrails?: None Help needed moving to and from a bed to a chair (including a wheelchair)?: A Little Help needed standing up from a chair using your arms (e.g., wheelchair or bedside chair)?: A Little Help needed to walk in hospital room?: A Little Help needed climbing 3-5 steps with a railing? : A Little 6 Click Score: 20    End of Session Equipment Utilized During Treatment: Gait belt Activity Tolerance: Treatment limited secondary to medical complications (Comment) (limited by HTN) Patient left: in bed;with call bell/phone within reach;with bed alarm set;with family/visitor present;with SCD's reapplied Nurse Communication: Mobility status;Other (comment) (BP; notified MD also) PT Visit Diagnosis: Unsteadiness on feet (R26.81);Other abnormalities of gait and mobility (R26.89);Difficulty in walking, not elsewhere classified (R26.2);Other symptoms and signs involving the nervous system (R29.898);Dizziness and giddiness (R42)    Time: 9326-7124 PT Time Calculation (min) (ACUTE ONLY): 19 min   Charges:   PT Evaluation $PT Eval Low Complexity: 1 Low          Raymond Gurney, PT, DPT Acute Rehabilitation Services  Office: [phone]   Jewel Baize 06/13/2022, 12:47 PM

## 2022-06-13 NOTE — Evaluation (Signed)
Occupational Therapy Evaluation Patient Details Name: Shelley Harper MRN: [id number] DOB: [birth year] Today's Date: 06/13/2022   History of Present Illness Shelley Harper is a 51 y.o. female who reports sudden onset thunderclap headache on 06/10/2022 while she was at the gym exercising.CT head demonstrated acute intraparenchymal hematoma within the right occipital lobe with small volume cytotoxic edema and small volume subarachnoid hemorrhage.   Clinical Impression   This 51 yo female admitted with above presents to acute OT with PLOF of being totally independent with all basic ADLs, IADLs, working, driving. She currently is limited by dizziness (standing more than sitting) and BP being high. She will continue to benefit from acute OT with follow up TBD once we can get her up and moving more.      Recommendations for follow up therapy are one component of a multi-disciplinary discharge planning process, led by the attending physician.  Recommendations may be updated based on patient status, additional functional criteria and insurance authorization.   Follow Up Recommendations   (HH v OP v none--once we can get her up and moving more this can be clarified)    Assistance Recommended at Discharge Frequent or constant Supervision/Assistance  Patient can return home with the following A lot of help with walking and/or transfers;A little help with bathing/dressing/bathroom;Assistance with cooking/housework;Assist for transportation;Help with stairs or ramp for entrance    Functional Status Assessment  Patient has had a recent decline in their functional status and demonstrates the ability to make significant improvements in function in a reasonable and predictable amount of time.  Equipment Recommendations  None recommended by OT       Precautions / Restrictions Precautions Precautions: Fall Precaution Comments: dizzy with sitting up and standing (BP acutually  high) Restrictions Weight Bearing Restrictions: No      Mobility Bed Mobility Overal bed mobility: Modified Independent             General bed mobility comments: increased time; once at EOB she sat for at least one minute doing ankle pumps due to c/o mild dizziness (BP up not down)    Transfers Overall transfer level: Needs assistance Equipment used: 2 person hand held assist Transfers: Sit to/from Stand Sit to Stand: Min assist, +2 physical assistance           General transfer comment: Min A +2 side step up towards [facility]      Balance Overall balance assessment: Needs assistance Sitting-balance support: No upper extremity supported, Feet supported Sitting balance-Leahy Scale: Good Sitting balance - Comments: able to cross legs and donn socks without LOB while seated EOB   Standing balance support: Bilateral upper extremity supported Standing balance-Leahy Scale: Poor                             ADL either performed or assessed with clinical judgement   ADL Overall ADL's : Needs assistance/impaired Eating/Feeding: Independent;Sitting   Grooming: Set up;Sitting   Upper Body Bathing: Set up;Sitting   Lower Body Bathing: Minimal assistance;+2 for physical assistance;Sit to/from stand   Upper Body Dressing : Set up;Sitting   Lower Body Dressing: Minimal assistance;+2 for safety/equipment   Toilet Transfer: Minimal assistance;+2 for physical assistance Toilet Transfer Details (indicate cue type and reason): side step up towards [facility] Toileting- Clothing Manipulation and Hygiene: Minimal assistance Toileting - Clothing Manipulation Details (indicate cue type and reason): min A +2 sit<>stand  Vision Baseline Vision/History: 0 No visual deficits Additional Comments: vision to be further tested            Pertinent Vitals/Pain Pain Assessment Pain Assessment: No/denies pain     Hand Dominance Right   Extremity/Trunk Assessment  Upper Extremity Assessment Upper Extremity Assessment: LUE deficits/detail LUE Deficits / Details: did not some proprioception issues with grabbing sock with left hand when donning both socks (pt reports no numbness or tingling)              Cognition Arousal/Alertness: Awake/alert Behavior During Therapy: WFL for tasks assessed/performed Overall Cognitive Status: Within Functional Limits for tasks assessed                                       General Comments  145/72 (94) supine, elevated to 184/91 (118) once sitting EOB, stayed at 169/88 (109) with repeat BP in sitting, and stayed at 180/92 (119) once supine end of session.            Home Living Family/patient expects to be discharged to:: Private residence Living Arrangements: Spouse/significant other;Parent Available Help at Discharge: Family;Available 24 hours/day (husband works; parents home all day) Type of Home: House Home Access: Stairs to enter [employer] of Steps: 1 Entrance Stairs-Rails: None Home Layout: Two level;Able to live on main level with bedroom/bathroom (her bedroom is typically upstairs though) Alternate Level Stairs-Number of Steps: 10 Alternate Level Stairs-Rails: Right (ascending) Bathroom Shower/Tub: Walk-in shower;Door (downstairs and upstairs)   Bathroom Toilet: Handicapped height (downstairs, standard upstairs)     Home Equipment: Grab bars - tub/shower (grab bars in shower downstairs only)          Prior Functioning/Environment Prior Level of Function : Independent/Modified Independent;Driving;Working/employed               ADLs Comments: Works in [employer]. Wears glasses at all times.        OT Problem List: Impaired balance (sitting and/or standing)      OT Treatment/Interventions: Self-care/ADL training;DME and/or AE instruction;Patient/family education;Balance training    OT Goals(Current goals can be found in the care plan section) Acute  Rehab OT Goals Patient Stated Goal: to get back to normal, back to the gym OT Goal Formulation: With patient Time For Goal Achievement: 06/27/22 Potential to Achieve Goals: Good  OT Frequency: Min 2X/week    Co-evaluation PT/OT/SLP Co-Evaluation/Treatment: Yes Reason for Co-Treatment: To address functional/ADL transfers;For patient/therapist safety PT goals addressed during session: Mobility/safety with mobility;Balance;Strengthening/ROM OT goals addressed during session: ADL's and self-care;Strengthening/ROM      AM-PAC OT "6 Clicks" Daily Activity     Outcome Measure Help from another person eating meals?: None Help from another person taking care of personal grooming?: A Little Help from another person toileting, which includes using toliet, bedpan, or urinal?: A Little Help from another person bathing (including washing, rinsing, drying)?: A Little Help from another person to put on and taking off regular upper body clothing?: A Little Help from another person to put on and taking off regular lower body clothing?: A Little 6 Click Score: 19   End of Session Equipment Utilized During Treatment: Gait belt Nurse Communication: Mobility status (BP issues (PT also communicated this to Dr. Pearlean Brownie via chat text))  Activity Tolerance:  (limited by increase in BP above parameters set in chart) Patient left: in bed;with call bell/phone within reach;with bed alarm set;with family/visitor present  OT Visit Diagnosis: Unsteadiness on feet (R26.81);Other abnormalities of gait and mobility (R26.89);Muscle weakness (generalized) (M62.81)                Time: 7616-0737 OT Time Calculation (min): 16 min Charges:  OT General Charges $OT Visit: 1 Visit OT Evaluation $OT Eval Low Complexity: 1 Low  Golden Circle, OTR/L Acute Rehab Services [facility] [phone] Office [phone]    Almon Register 06/13/2022, 11:21 AM

## 2022-06-13 NOTE — Progress Notes (Addendum)
Patient complaining of 9/10 headache after giving PRN tylenol/codeine. Patient also more lethargic. Recently gave patient PRN hydralazine for BP control. Dr Leonie Man notified about pain and lethargy and the need for PRN BP meds. Was told patient has been on and off lethargic and new orders for PRN tramadol and increased dosage of scheduled topamax. I informed patient and family member to let RN know if headache continues to get worse.  1530 update: New order for hypertonic saline by Dr Leonie Man.  Montez Hageman RN

## 2022-06-13 NOTE — Progress Notes (Addendum)
STROKE TEAM PROGRESS NOTE   INTERVAL HISTORY Family friends were by bedside. Patient was seen in bed this AM. Patient reports improving HA. She has slightly increased energy but still is sedated like yesterday.  Cerebral angiogram yesterday shows no AVM or aneurysm but shows focal areas of moderate narrowing involving right anterior pericallosal branch of anterior cerebral artery and posterior parietal branch of left middle cerebral artery findings of unclear significance. ESR and C-reactive protein are normal.  Repeat CT scan is pending. Vitals:   06/13/22 0800 06/13/22 0900 06/13/22 1000 06/13/22 1202  BP: (!) 140/79 (!) 147/76 (!) 145/72   Pulse: (!) 54 (!) 52 (!) 55   Resp: 11 11 11    Temp: 97.9 F (36.6 C)   98 F (36.7 C)  TempSrc: Oral   Oral  SpO2: 94% 95% 95%   Weight:      Height:       CBC:  Recent Labs  Lab 06/10/22 2137  WBC 13.3*  NEUTROABS 10.9*  HGB 12.0  HCT 37.4  MCV 86.8  PLT 914   Basic Metabolic Panel:  Recent Labs  Lab 06/10/22 2137  NA 138  K 3.7  CL 104  CO2 22  GLUCOSE 153*  BUN 16  CREATININE 0.65  CALCIUM 9.3   Lipid Panel:  Recent Labs  Lab 06/12/22 0257  CHOL 151  TRIG 77  HDL 54  CHOLHDL 2.8  VLDL 15  LDLCALC 82   HgbA1c:  Recent Labs  Lab 06/12/22 0257  HGBA1C 5.8*   Urine Drug Screen:  Recent Labs  Lab 06/11/22 1031  LABOPIA POSITIVE*  COCAINSCRNUR NONE DETECTED  LABBENZ NONE DETECTED  AMPHETMU NONE DETECTED  THCU NONE DETECTED  LABBARB NONE DETECTED    Alcohol Level No results for input(s): "ETH" in the last 168 hours.  IMAGING past 24 hours No results found.  PHYSICAL EXAM General: Pleasant, well-appearing in bed. No acute distress. Extremities: Normal ROM. 5/5 strength in RUE, LUE, RLE, and LLE Skin: Warm and dry. No obvious rash or lesions. Neuro: A&Ox3. CN II-XII intact. No cerebellar abnormalities. Moves all extremities. Normal sensation. Left homonomous hemianopsia. No dysarthria. Psych: Normal  mood and affect    ASSESSMENT/PLAN Shelley Harper is a 51 y.o. female with no significant past medical history who reports sudden onset thunderclap headache at 1730 on 06/10/2022 while she was at the gym exercising.   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 Mild regional edema with minimal associated mass effect. No evidence of underlying mass or lesion. Cerebral angiogram shows smooth focal areas of moderate narrowing involving the right anterior cerebral artery pericallosal branch, and posterior parietal branch of the left middle cerebral artery. 2D Echo EF 60-65% LDL 82 HgbA1c 5.8 VTE prophylaxis - lovenox    Diet   Diet Heart Room service appropriate? Yes; Fluid consistency: Thin   No anticoagulation indicated at this time due to intracranial bleeding Started tylenol codeine Q6H PRN for moderate pain and Topamax 25 mg twice daily Continue IV Zofran, Topamax Cont Phenergan 12.20m Q6H PRN for nausea/vomiting Therapy recommendations:  HH vs OP vs none by OT. PT recommends one  more day of therapy. Disposition:  home FMLA paperwork filled out  Hypertension Home meds:  none Stable Permissive hypertension (OK if < 220/120) but gradually normalize in 5-7 days Long-term BP goal normotensive Continue IV hydralazine 20m Q6H PRN Start Norvasc 548mdaily IVNS 7519mr    Prediabetes Home meds:  none HgbA1c 5.8, goal < 7.0  Other Stroke Risk Factors Family hx stroke (father)  Other Active Problems none  Hospital day # 2  DanStormy FabianD PGY-1 Psychiatry  I have personally obtained  history,examined this patient, reviewed notes, independently viewed imaging studies, participated in medical decision making and plan of care.ROS completed by me personally and pertinent positives fully documented  I have made any additions or clarifications directly to the above note. Agree with note above.  Plan to mobilize out of bed.  Physical therapy consults.  Use hydralazine 20 mg every 6 hourly as needed for SBP goal below 160 and start Norvasc 5 mg daily.  Check CT scan of the head.  Will consider discharge in next 1 to 2 days if patient can ambulate safely.  Discussed with patient, friend and husband at the bedside and answered questions.This patient is critically ill and at significant risk of neurological worsening, death and care requires constant monitoring of vital signs, hemodynamics,respiratory and cardiac monitoring, extensive review of multiple databases, frequent neurological assessment, discussion with family, other specialists and medical decision making of high complexity.I have made any additions or clarifications directly to the above note.This critical care time does not reflect procedure time, or teaching time or supervisory time of PA/NP/Med Resident etc but could involve care discussion time.  I spent 30 minutes of neurocritical care time  in the care of  this patient.      PraAntony ContrasD Medical Director [facility] Pager: [phone]/08/2022 1:45 PM   To contact Stroke Continuity provider, please refer to [email or web address], contact General Neurology

## 2022-06-14 DIAGNOSIS — I610 Nontraumatic intracerebral hemorrhage in hemisphere, subcortical: Secondary | ICD-10-CM | POA: Diagnosis not present

## 2022-06-14 LAB — SODIUM
Sodium: 140 mmol/L (ref 135–145)
Sodium: 140 mmol/L (ref 135–145)
Sodium: 138 mmol/L (ref 135–145)
Sodium: 138 mmol/L (ref 135–145)

## 2022-06-14 LAB — ANA W/REFLEX IF POSITIVE: Anti Nuclear Antibody (ANA): NEGATIVE

## 2022-06-14 NOTE — Progress Notes (Signed)
SLP Cancellation Note  Patient Details Name: Shelley Harper MRN: [id number] DOB: 02-28-1971   Cancelled treatment:       Reason Eval/Treat Not Completed: Patient at procedure or test/unavailable  Unable to provide cognitive therapy at this time. Visitor alerted SLP that pt was dizzy - RN notified. PT waiting to see pt as well. Will continue efforts.   Kentley Blyden B. Quentin Ore, [facility], [place] Speech Language Pathologist Office: [phone]  Shonna Chock 06/14/2022, 1:23 PM

## 2022-06-14 NOTE — Progress Notes (Signed)
Occupational Therapy Treatment Patient Details Name: Shelley Harper MRN: [id number] DOB: September 21, 1970 Today's Date: 06/14/2022   History of present illness Shelley Harper is a 51 y.o. female who reports sudden onset thunderclap headache on 06/10/2022 while she was at the gym exercising. CT head demonstrated acute intraparenchymal hematoma within the right occipital lobe with small volume cytotoxic edema and small volume subarachnoid hemorrhage. No significant past medical hx.   OT comments  This 51 yo female seen today to look at ambulation and ADLs as well as to check vision. She presents with a Left inferior homonymous quadranopsia. We discussed strategies for working on this and she and sister-in-law verbalized understanding. Pt is aware she should not drive while her vision is impaired. OPOT is now recommended. We will continue to follow.   Recommendations for follow up therapy are one component of a multi-disciplinary discharge planning process, led by the attending physician.  Recommendations may be updated based on patient status, additional functional criteria and insurance authorization.    Follow Up Recommendations  Outpatient OT    Assistance Recommended at Discharge PRN  Patient can return home with the following  A little help with bathing/dressing/bathroom;Assistance with cooking/housework;Assist for transportation;Help with stairs or ramp for entrance   Equipment Recommendations  None recommended by OT       Precautions / Restrictions Precautions Precautions: Fall Precaution Comments: dizzy at times; SBP goal <170 this Restrictions Weight Bearing Restrictions: No       Mobility Bed Mobility Overal bed mobility: Independent                  Transfers Overall transfer level: Needs assistance                 General transfer comment: overall at a min guard A level     Balance Overall balance assessment: Needs  assistance Sitting-balance support: No upper extremity supported, Feet supported Sitting balance-Leahy Scale: Normal     Standing balance support: No upper extremity supported, During functional activity Standing balance-Leahy Scale: Good                             ADL either performed or assessed with clinical judgement   ADL Overall ADL's : Needs assistance/impaired                                       General ADL Comments: Overall at a min guard A levle when up on her feet due to vision deficits. Recommended she not try and cut with a sharp knife for now due to vision deficits.    Extremity/Trunk Assessment Upper Extremity Assessment Upper Extremity Assessment: Overall WFL for tasks assessed            Vision   Vision Assessment?: Yes Eye Alignment: Within Functional Limits Ocular Range of Motion: Within Functional Limits Alignment/Gaze Preference: Within Defined Limits Tracking/Visual Pursuits: Able to track stimulus in all quads without difficulty Convergence: Within functional limits Visual Fields: Left inferior homonymous quadranopsia Additional Comments: Educated on strategies for trying to make sure she sees everything in her environment          Cognition Arousal/Alertness: Awake/alert Behavior During Therapy: WFL for tasks assessed/performed Overall Cognitive Status: Within Functional Limits for tasks assessed  General Comments: Follows commands appropriately albeit slightly delayed at times              General Comments BP 169/110s while ambulating; denied any dizziness or headache throughout; provided pt and her family with gait belt and education to guard her on stairs and with gait until her balance improves    Pertinent Vitals/ Pain       Pain Assessment Pain Assessment: No/denies pain         Frequency  Min 2X/week        Progress Toward Goals  OT  Goals(current goals can now be found in the care plan section)  Progress towards OT goals: Progressing toward goals  Acute Rehab OT Goals OT Goal Formulation: With patient Time For Goal Achievement: 06/27/22 Potential to Achieve Goals: Good  Plan Discharge plan needs to be updated       AM-PAC OT "6 Clicks" Daily Activity     Outcome Measure   Help from another person eating meals?: None Help from another person taking care of personal grooming?: A Little Help from another person toileting, which includes using toliet, bedpan, or urinal?: A Little Help from another person bathing (including washing, rinsing, drying)?: A Little Help from another person to put on and taking off regular upper body clothing?: A Little Help from another person to put on and taking off regular lower body clothing?: A Little 6 Click Score: 19    End of Session    OT Visit Diagnosis: Unsteadiness on feet (R26.81);Other abnormalities of gait and mobility (R26.89);Muscle weakness (generalized) (M62.81);Low vision, both eyes (H54.2)   Activity Tolerance Patient tolerated treatment well   Patient Left in bed;with call bell/phone within reach;with bed alarm set;with family/visitor present           Time: 0175-1025 OT Time Calculation (min): 37 min  Charges: OT General Charges $OT Visit: 1 Visit OT Treatments $Self Care/Home Management : 23-37 mins  Golden Circle, OTR/L Acute Rehab Services [facility] [phone] Office [phone]    Almon Register 06/14/2022, 5:42 PM

## 2022-06-14 NOTE — Progress Notes (Addendum)
Imaging renal STROKE TEAM PROGRESS NOTE   INTERVAL HISTORY Family friends were by bedside. Patient was seen in bed this AM. Patient reports HA is greatly improving, rating the severity a 1/10 today compared to 7/10 yesterday. She feels the medications have been helpful to control her HA. She was started on hypertonic saline at 75 cc an hour yesterday but serum sodium is still not at goal but patient is feeling a lot better. Repeat CT scan shows Unchanged size of right occipital parenchymal hematoma with increased, mild surrounding edema. Unchanged small volume adjacent subarachnoid hemorrhage. Vitals:   06/14/22 0400 06/14/22 0500 06/14/22 0600 06/14/22 0700  BP: (!) 139/90 136/84 (!) 144/85 128/75  Pulse: (!) 59 (!) 59 (!) 59 63  Resp: 15 15 13 14   Temp: 99.5 F (37.5 C)     TempSrc: Oral     SpO2: 97% 97% 97% 97%  Weight:      Height:       CBC:  Recent Labs  Lab 06/10/22 2137  WBC 13.3*  NEUTROABS 10.9*  HGB 12.0  HCT 37.4  MCV 86.8  PLT 564   Basic Metabolic Panel:  Recent Labs  Lab 06/10/22 2137 06/13/22 1648 06/13/22 2150 06/14/22 0332  NA 138   < > 139 140  K 3.7  --   --   --   CL 104  --   --   --   CO2 22  --   --   --   GLUCOSE 153*  --   --   --   BUN 16  --   --   --   CREATININE 0.65  --   --   --   CALCIUM 9.3  --   --   --    < > = values in this interval not displayed.   Lipid Panel:  Recent Labs  Lab 06/12/22 0257  CHOL 151  TRIG 77  HDL 54  CHOLHDL 2.8  VLDL 15  LDLCALC 82   HgbA1c:  Recent Labs  Lab 06/12/22 0257  HGBA1C 5.8*   Urine Drug Screen:  Recent Labs  Lab 06/11/22 1031  LABOPIA POSITIVE*  COCAINSCRNUR NONE DETECTED  LABBENZ NONE DETECTED  AMPHETMU NONE DETECTED  THCU NONE DETECTED  LABBARB NONE DETECTED    Alcohol Level No results for input(s): "ETH" in the last 168 hours.  IMAGING past 24 hours CT HEAD WO CONTRAST (5MM)  Result Date: 06/13/2022 CLINICAL DATA:  Stroke follow-up. EXAM: CT HEAD WITHOUT CONTRAST  TECHNIQUE: Contiguous axial images were obtained from the base of the skull through the vertex without intravenous contrast. RADIATION DOSE REDUCTION: This exam was performed according to the departmental dose-optimization program which includes automated exposure control, adjustment of the mA and/or kV according to patient size and/or use of iterative reconstruction technique. COMPARISON:  Head CT and MRI 06/11/2022 FINDINGS: Brain: A 2.9 x 2.9 x 3.0 cm (approximate volume of 13 mL) parenchymal hematoma superiorly in the right occipital lobe has not significantly changed in size. Mild surrounding edema has mildly increased. Small volume adjacent subarachnoid hemorrhage is unchanged. No midline shift, new intracranial hemorrhage, or acute infarct separate from the hemorrhage is identified. The ventricles are normal in size. Vascular: No hyperdense vessel. Skull: No acute fracture or suspicious osseous lesion. Sinuses/Orbits: Visualized paranasal sinuses and mastoid air cells are clear. Unremarkable orbits. Other: None. IMPRESSION: 1. Unchanged size of right occipital parenchymal hematoma with increased, mild surrounding edema. 2. Unchanged small volume adjacent subarachnoid hemorrhage.  Electronically Signed   By: Sebastian Ache M.D.   On: 06/13/2022 13:26    PHYSICAL EXAM General: Pleasant, well-appearing  in bed. No acute distress. Extremities: Normal ROM. 5/5 strength in RUE, LUE, RLE, and LLE Skin: Warm and dry. No obvious rash or lesions. Neuro: A&Ox3. CN III-XII intact. No cerebellar abnormalities. Moves all extremities. Normal sensation. No focal deficit. No dysarthria. Left homonomous hemianopsia improving in nasal region, temporal still present   Psych: Normal mood and affect    ASSESSMENT/PLAN Shelley Harper is a 51 y.o. female with no significant past medical history who reports sudden onset thunderclap headache at 1730 on 06/10/2022 while she was at the gym exercising.   Right  occipital lobe ICH with ICH score of 0 Etiology:  possible RCVS  CTH was negative for a large hypodensity concerning for a large territory infarct or hyperdensity concerning for an ICH CTA head & neck no LVO or high-grade stenosis of the intracranial arteries Repeat CT shows stable ICH with some edema MRI  Superior right occipital lobe intra-axial hematoma, just caudal to the parieto-occipital sulcus is stable measuring 12 mL. Mild regional edema with minimal associated mass effect. No evidence of underlying mass or lesion. Cerebral angiogram shows smooth focal areas of moderate narrowing involving the right anterior cerebral artery pericallosal branch, and posterior parietal branch of the left middle cerebral artery. 2D Echo EF 60-65% LDL 82 HgbA1c 5.8 VTE prophylaxis - lovenox    Diet   Diet Heart Room service appropriate? Yes; Fluid consistency: Thin   No anticoagulation indicated at this time due to intracranial bleeding Started tramadol 50mg  Q6H PRN Increased Topamax from 25 mg to 50 mg twice daily Continue IV Zofran, Topamax Continue Phenergan 12.5mg  Q6H PRN for nausea/vomiting Therapy recommendations:  HH vs OP vs none by OT. PT recommends one more day of therapy. Disposition:  home FMLA paperwork filled out  Hypertension Home meds:  none Stable Permissive hypertension (OK if < 220/120) but gradually normalize in 5-7 days Long-term BP goal normotensive Continue IV hydralazine 20mg  Q6H PRN   mg daily Continue Cardizem SR 120 mg daily Tapering off hypertonic solution and discontinuing   Prediabetes Home meds:  none HgbA1c 5.8, goal < 7.0  Other Stroke Risk Factors Family hx stroke (father)  Other Active Problems none  Hospital day # 3  , MD PGY-1 Psychiatry  I have personally obtained history,examined this patient, reviewed notes, independently viewed imaging studies, participated in medical decision making and plan of care.ROS completed by me  personally and pertinent positives fully documented  I have made any additions or clarifications directly to the above note. Agree with note above.  Patient appears much improved today with less headache and nausea and she has been eating well.  Recommend taper hypertonic saline and discontinue over the next 24 hours.  Continue Topamax 50 mg twice daily and tramadol as needed for headaches.  Continue Cardizem SR 120 mg daily for hypertension.  Mobilize out of bed.  Physical Occupational Therapy consults.  Hopefully discharge tomorrow if stable.  Long discussion with patient and friend at the bedside and answered questions.This patient is critically ill and at significant risk of neurological worsening, death and care requires constant monitoring of vital signs, hemodynamics,respiratory and cardiac monitoring, extensive review of multiple databases, frequent neurological assessment, discussion with family, other specialists and medical decision making of high complexity.I have made any additions or clarifications directly to the above note.This critical care time does not reflect procedure time,  or teaching time or supervisory time of PA/NP/Med Resident etc but could involve care discussion time.  I spent 30 minutes of neurocritical care time  in the care of  this patient.      Delia Heady, MD Medical Director [facility] Pager: [phone] 06/14/2022 12:12 PM   To contact Stroke Continuity provider, please refer to [email or web address]. After hours, contact General Neurology

## 2022-06-14 NOTE — Progress Notes (Signed)
Physical Therapy Treatment Patient Details Name: Shelley Harper MRN: [id number] DOB: 1971/06/26 Today's Date: 06/14/2022   History of Present Illness Shelley Harper is a 51 y.o. female who reports sudden onset thunderclap headache on 06/10/2022 while she was at the gym exercising. CT head demonstrated acute intraparenchymal hematoma within the right occipital lobe with small volume cytotoxic edema and small volume subarachnoid hemorrhage. No significant past medical hx.    PT Comments    Pt is demonstrating great progress with mobility, no longer limited by a headache or dizziness this session. Her BP while ambulating went up to 169/110s (RN aware). Pt was able to progress to ambulating the unit without UE support and navigate x6 stairs at a min guard assist level. She does mobilize slowly due to noted mild balance deficits, but did not have any LOB. She appears to be fairly self-aware of her deficits and how to maintain her safety. Continuing to recommend OPPT at d/c to ensure pt maximizes her return to baseline as she is normally very active. Will continue to follow acutely.    Recommendations for follow up therapy are one component of a multi-disciplinary discharge planning process, led by the attending physician.  Recommendations may be updated based on patient status, additional functional criteria and insurance authorization.  Follow Up Recommendations  Outpatient PT     Assistance Recommended at Discharge Intermittent Supervision/Assistance  Patient can return home with the following A little help with walking and/or transfers;A little help with bathing/dressing/bathroom;Assistance with cooking/housework;Assist for transportation;Help with stairs or ramp for entrance   Equipment Recommendations  None recommended by PT    Recommendations for Other Services       Precautions / Restrictions Precautions Precautions: Fall Precaution Comments: dizzy at times; SBP  goal <170 this session per coordination with RN Restrictions Weight Bearing Restrictions: No     Mobility  Bed Mobility               General bed mobility comments: sitting up in recliner upon arrival    Transfers Overall transfer level: Needs assistance Equipment used: 1 person hand held assist Transfers: Sit to/from Stand Sit to Stand: Min assist           General transfer comment: Pt's visitor provided R HHA when pt came to stand.    Ambulation/Gait Ambulation/Gait assistance: Min guard Gait Distance (Feet): 350 Feet Assistive device: None Gait Pattern/deviations: Step-through pattern, Decreased stride length, Narrow base of support Gait velocity: reduced Gait velocity interpretation: 1.31 - 2.62 ft/sec, indicative of limited community ambulator   General Gait Details: Pt with slow, small steps with narrow BOS. Verbal cues provided to widen stance, with good carryover noted. Pt cued to increase speed as distance progressed. Noted improved stability/fluidity and stride length as distance progressed, but still noted some mild trunk sway. No LOB, min guard for safety   Stairs Stairs: Yes Stairs assistance: Min guard Stair Management: One rail Right, One rail Left, No rails, Step to pattern, Forwards Number of Stairs: 6 General stair comments: Ascends with R rail and descends with L rail the first x3 stairs to simulate home set-up then progressed to no rail ascending the final x3 stairs and intermittent L rail use descending the final x3 stairs. No LOB, but noted mild trunk sway and pt with increased effort and slowed speed, min guard for safety   Wheelchair Mobility    Modified Rankin (Stroke Patients Only) Modified Rankin (Stroke Patients Only) Pre-Morbid Rankin Score: No symptoms Modified Rankin:  Moderately severe disability     Balance Overall balance assessment: Needs assistance Sitting-balance support: No upper extremity supported, Feet  supported Sitting balance-Leahy Scale: Normal     Standing balance support: No upper extremity supported, During functional activity Standing balance-Leahy Scale: Good Standing balance comment: Able to ambulate and navigate stairs with no UE support, but mild trunk sway noted                            Cognition Arousal/Alertness: Awake/alert Behavior During Therapy: WFL for tasks assessed/performed Overall Cognitive Status: Within Functional Limits for tasks assessed                                          Exercises      General Comments General comments (skin integrity, edema, etc.): BP 169/110s while ambulating; denied any dizziness or headache throughout; provided pt and her family with gait belt and education to guard her on stairs and with gait until her balance improves      Pertinent Vitals/Pain Pain Assessment Pain Assessment: No/denies pain    Home Living                          Prior Function            PT Goals (current goals can now be found in the care plan section) Acute Rehab PT Goals Patient Stated Goal: to get back to normal PT Goal Formulation: With patient/family Time For Goal Achievement: 06/27/22 Potential to Achieve Goals: Good Progress towards PT goals: Progressing toward goals    Frequency    Min 4X/week      PT Plan Current plan remains appropriate    Co-evaluation              AM-PAC PT "6 Clicks" Mobility   Outcome Measure  Help needed turning from your back to your side while in a flat bed without using bedrails?: None Help needed moving from lying on your back to sitting on the side of a flat bed without using bedrails?: None Help needed moving to and from a bed to a chair (including a wheelchair)?: A Little Help needed standing up from a chair using your arms (e.g., wheelchair or bedside chair)?: A Little Help needed to walk in hospital room?: A Little Help needed climbing 3-5  steps with a railing? : A Little 6 Click Score: 20    End of Session Equipment Utilized During Treatment: Gait belt Activity Tolerance: Patient tolerated treatment well Patient left: with family/visitor present;in chair Nurse Communication: Mobility status;Other (comment) (BP) PT Visit Diagnosis: Unsteadiness on feet (R26.81);Other abnormalities of gait and mobility (R26.89);Difficulty in walking, not elsewhere classified (R26.2);Other symptoms and signs involving the nervous system (R29.898);Dizziness and giddiness (R42)     Time: 5993-5701 PT Time Calculation (min) (ACUTE ONLY): 28 min  Charges:  $Gait Training: 23-37 mins                     Moishe Spice, PT, DPT Acute Rehabilitation Services  Office: [phone]    Orvan Falconer 06/14/2022, 2:46 PM

## 2022-06-14 NOTE — TOC Transition Note (Signed)
Transition of Care [facility]) - CM/SW Discharge Note   Patient Details  Name: Jetaime Pinnix MRN: [id number] Date of Birth: 08/09/1971  [facility] CM/SW Contact:  Ella Bodo, RN Phone Number: 06/14/2022, 4:06 PM   Clinical Narrative:    Mylena Sedberry is a 51 y.o. female who reports sudden onset thunderclap headache on 06/10/2022 while she was at the gym exercising. CT head demonstrated acute intraparenchymal hematoma within the right occipital lobe with small volume cytotoxic edema and small volume subarachnoid hemorrhage. Prior to admission, patient independent and living with husband and parents, who can provide needed assistance at home.  PT/OT recommending outpatient therapies, and referral has been made to [facility] for follow-up.  No other discharge needs identified.                          Social Determinants of Health (SDOH) Interventions     Readmission Risk Interventions     No data to display         Reinaldo Raddle, RN, BSN  Trauma/Neuro ICU Case Manager [phone]

## 2022-06-15 DIAGNOSIS — I610 Nontraumatic intracerebral hemorrhage in hemisphere, subcortical: Secondary | ICD-10-CM | POA: Diagnosis not present

## 2022-06-15 DIAGNOSIS — I10 Essential (primary) hypertension: Secondary | ICD-10-CM | POA: Diagnosis not present

## 2022-06-15 LAB — CBC
HCT: 37.3 % (ref 36.0–46.0)
Hemoglobin: 12.6 g/dL (ref 12.0–15.0)
MCH: 29.2 pg (ref 26.0–34.0)
MCHC: 33.8 g/dL (ref 30.0–36.0)
MCV: 86.3 fL (ref 80.0–100.0)
Platelets: 257 10*3/uL (ref 150–400)
RBC: 4.32 MIL/uL (ref 3.87–5.11)
RDW: 13.3 % (ref 11.5–15.5)
WBC: 8.8 10*3/uL (ref 4.0–10.5)
nRBC: 0 % (ref 0.0–0.2)

## 2022-06-15 LAB — ANTIPHOSPHOLIPID SYNDROME EVAL, BLD
Anticardiolipin IgA: <9 APL U/mL (ref 0–11)
Anticardiolipin IgG: <9 GPL U/mL (ref 0–14)
Anticardiolipin IgM: <9 MPL U/mL (ref 0–12)
DRVVT: 36.2 s (ref 0.0–47.0)
PTT Lupus Anticoagulant: 31.9 s (ref 0.0–43.5)
Phosphatydalserine, IgA: <1 APS Units (ref 0–19)
Phosphatydalserine, IgG: <9 Units (ref 0–30)
Phosphatydalserine, IgM: 19 Units (ref 0–30)

## 2022-06-15 LAB — BASIC METABOLIC PANEL
Anion gap: 11 (ref 5–15)
BUN: 12 mg/dL (ref 6–20)
CO2: 18 mmol/L — ABNORMAL LOW (ref 22–32)
Calcium: 9.3 mg/dL (ref 8.9–10.3)
Chloride: 108 mmol/L (ref 98–111)
Creatinine, Ser: 0.74 mg/dL (ref 0.44–1.00)
GFR, Estimated: >60 mL/min (ref >60)
Glucose, Bld: 114 mg/dL — ABNORMAL HIGH (ref 70–99)
Potassium: 3.7 mmol/L (ref 3.5–5.1)
Sodium: 137 mmol/L (ref 135–145)

## 2022-06-15 LAB — SODIUM: Sodium: 137 mmol/L (ref 135–145)

## 2022-06-15 MED ORDER — VERAPAMIL HCL ER 120 MG PO TBCR: 120.0000 mg | EXTENDED_RELEASE_TABLET | Freq: Every day | ORAL | 2 refills | Status: DC

## 2022-06-15 MED ORDER — VERAPAMIL HCL ER 120 MG PO TBCR
120.0000 mg | EXTENDED_RELEASE_TABLET | Freq: Every day | ORAL | Status: DC
  Administered 2022-06-15: 120 mg via ORAL
  Filled 2022-06-15 (×2): qty 1

## 2022-06-15 NOTE — Progress Notes (Signed)
Patient AVS printed and reviewed with patient and spouse. IV removed and patient dressed to go. Will take her outside via wheelchair. Dyllin Gulley, Rande Brunt, RN

## 2022-06-15 NOTE — Discharge Instructions (Addendum)
You were admitted due to brain bleed. We think it may related to your aggressive exercise but could not be certain since other etiology can not be completely excluded. You gradually improved and now in good condition for discharge. We prescribed tompamax for your headache and also verapamil long acting for your blood pressure and brain vessel spasm. Please check you blood pressure at home and write down to show to your PCP. Please follow up with your PCP in 1-2 weeks and with eye doctors for visual field testing. You will also see Dr. Leonie Man at clinic for bleeding follow up. Please do not drive until you cleared by your eye doctor. Gradually resume your activity, avoid aggressive work out. Please call [phone] if you have recurrent stroke symptoms.

## 2022-06-15 NOTE — Discharge Summary (Signed)
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   topiramate 25 MG tablet Commonly known as: TOPAMAX Take 1 tablet (25 mg total) by mouth 2 (two) times daily.   verapamil 120 MG CR tablet Commonly known as: CALAN-SR Take 1 tablet (120 mg total) by mouth daily.   Vitamin D (Ergocalciferol) 1.25 MG (50000 UNIT) Caps capsule Commonly known as: DRISDOL Take 50,000 Units by mouth once a week. Sundays        LABORATORY STUDIES CBC    Component Value Date/Time   WBC 8.8 06/15/2022 0751   RBC 4.32 06/15/2022 0751   HGB 12.6 06/15/2022 0751   HCT 37.3 06/15/2022 0751   PLT 257 06/15/2022 0751   MCV 86.3 06/15/2022 0751   MCH 29.2 06/15/2022 0751   MCHC 33.8 06/15/2022 0751   RDW 13.3 06/15/2022 0751   LYMPHSABS 1.9 06/10/2022 2137   MONOABS 0.4 06/10/2022 2137   EOSABS 0.0 06/10/2022 2137   BASOSABS 0.0 06/10/2022 2137   CMP    Component Value Date/Time   NA 137 06/15/2022 0751   K 3.7 06/15/2022 0751   CL 108 06/15/2022 0751   CO2 18 (L) 06/15/2022 0751   GLUCOSE 114 (H) 06/15/2022 0751   BUN 12 06/15/2022 0751   CREATININE 0.74 06/15/2022 0751   CALCIUM 9.3 06/15/2022 0751   PROT 8.3 (H) 06/10/2022 2137   ALBUMIN 4.8 06/10/2022 2137   AST 23 06/10/2022 2137   ALT 18 06/10/2022 2137    ALKPHOS 62 06/10/2022 2137   BILITOT 0.7 06/10/2022 2137   GFRNONAA >60 06/15/2022 0751   COAGS Lab Results  Component Value Date   INR 1.1 06/10/2022   Lipid Panel    Component Value Date/Time   CHOL 151 06/12/2022 0257   TRIG 77 06/12/2022 0257   HDL 54 06/12/2022 0257   CHOLHDL 2.8 06/12/2022 0257   VLDL 15 06/12/2022 0257   LDLCALC 82 06/12/2022 0257   HgbA1C  Lab Results  Component Value Date   HGBA1C 5.8 (H) 06/12/2022   Urinalysis No results found for: "COLORURINE", "APPEARANCEUR", "LABSPEC", "PHURINE", "GLUCOSEU", "HGBUR", "BILIRUBINUR", "KETONESUR", "PROTEINUR", "UROBILINOGEN", "NITRITE", "LEUKOCYTESUR" Urine Drug Screen     Component Value Date/Time   LABOPIA POSITIVE (A) 06/11/2022 1031   COCAINSCRNUR NONE DETECTED 06/11/2022 1031   LABBENZ NONE DETECTED 06/11/2022 1031   AMPHETMU NONE DETECTED 06/11/2022 1031   THCU NONE DETECTED 06/11/2022 1031   LABBARB NONE DETECTED 06/11/2022 1031    Alcohol Level No results found for: "ETH"   SIGNIFICANT DIAGNOSTIC STUDIES CT HEAD WO CONTRAST ( )  Result Date: 06/13/2022 CLINICAL DATA:  Stroke follow-up. EXAM: CT HEAD WITHOUT CONTRAST TECHNIQUE: Contiguous axial images were obtained from the base of  the skull through the vertex without intravenous contrast. RADIATION DOSE REDUCTION: This exam was performed according to the departmental dose-optimization program which includes automated exposure control, adjustment of the mA and/or kV according to patient size and/or use of iterative reconstruction technique. COMPARISON:  Head CT and MRI 06/11/2022 FINDINGS: Brain: A 2.9 x 2.9 x 3.0 cm (approximate volume of 13 mL) parenchymal hematoma superiorly in the right occipital lobe has not significantly changed in size. Mild surrounding edema has mildly increased. Small volume adjacent subarachnoid hemorrhage is unchanged. No midline shift, new intracranial hemorrhage, or acute infarct separate from the hemorrhage is identified.  The ventricles are normal in size. Vascular: No hyperdense vessel. Skull: No acute fracture or suspicious osseous lesion. Sinuses/Orbits: Visualized paranasal sinuses and mastoid air cells are clear. Unremarkable orbits. Other: None. IMPRESSION: 1. Unchanged size of right occipital parenchymal hematoma with increased, mild surrounding edema. 2. Unchanged small volume adjacent subarachnoid hemorrhage. Electronically Signed   By: Sebastian Ache M.D.   On: 06/13/2022 13:26   IR ANGIO INTRA EXTRACRAN SEL INTERNAL CAROTID BILAT MOD SED  Result Date: 06/13/2022 CLINICAL DATA:  Headaches. Nausea and vomiting. Right parietal cortical subcortical hematoma. EXAM: IR ANGIO INTRA EXTRACRAN SEL INTERNAL CAROTID BILAT MOD SED COMPARISON:  CT angiogram of the head and neck June 10, 2022, and MRI brain June 11, 2022. MEDICATIONS: Heparin 1000 units IV. No antibiotic was administered within 1 hour of the procedure. ANESTHESIA/SEDATION: Versed 1 mg IV; Fentanyl 12.5 mcg IV Moderate Sedation Time:  35 minutes The patient was continuously monitored during the procedure by the interventional radiology nurse under my direct supervision. CONTRAST:  Omnipaque 300 60 mL. FLUOROSCOPY TIME:  Fluoroscopy Time: 9 minutes 0 seconds (919 mGy). COMPLICATIONS: None immediate. TECHNIQUE: Informed written consent was obtained from the patient after a thorough discussion of the procedural risks, benefits and alternatives. All questions were addressed. Maximal Sterile Barrier Technique was utilized including caps, mask, sterile gowns, sterile gloves, sterile drape, hand hygiene and skin antiseptic. A timeout was performed prior to the initiation of the procedure. The right groin was prepped and draped in the usual sterile fashion. Thereafter using modified Seldinger technique, transfemoral access into the right common femoral artery was obtained without difficulty. Over a 0.035 inch guidewire, a 5 French Pinnacle sheath was inserted. Through  this, and also over 0.035 inch guidewire, a 5 [place] JB 1 catheter was advanced to the aortic arch region and selectively positioned in the right common carotid artery, the right vertebral artery, the left common carotid artery and the left vertebral artery. FINDINGS: The right vertebral artery origin is widely patent. Vessel opacifies to the cranial skull base. A kink is noted just distal to the posterior-inferior cerebellar artery origin due to intra dural entry of the left vertebrobasilar junction. Opacified portions of the basilar artery, the posterior cerebral arteries and the superior cerebellar arteries demonstrate opacification into the capillary and venous phases. The right common carotid arteriogram demonstrates the right external carotid artery and its major branches to be widely patent. Right internal carotid artery at the bulb to the cranial skull base is patent. The petrous, the cavernous and the supraclinoid segments demonstrate opacification. Right middle cerebral artery and the right anterior cerebral artery opacify into the capillary and venous phases. Mild focal areas of narrowing were noted involving the right pericallosal artery in its anterior half. Left common carotid arteriogram demonstrates the left external carotid artery and its major branches to be patent. Left internal carotid artery to the cranial skull  base is patent. The petrous, the cavernous and the supraclinoid segments demonstrate wide patency. The left middle cerebral artery and the left anterior cerebral artery opacify into the capillary and venous phases. Focal areas of smooth caliber narrowing interspersed with normal caliber of the parietal branch of the inferior division is noted. Similar though less prominent similar changes are evident in the pericallosal A2 A3 segment. The dominant left vertebral artery origin is widely patent. The vessel is normal in caliber to the cranial skull base. Left vertebrobasilar junction, and  the left posterior-inferior cerebellar artery normally patent. The basilar artery, the posterior cerebral arteries, the superior cerebellar arteries and the anterior-inferior cerebellar arteries opacify into the capillary and venous phases. IMPRESSION: Mild focal areas of smooth narrowing involving the pericallosal arteries bilaterally, slightly more prominent involving the posterior parietal branch of the inferior division of the left middle cerebral artery. No angiographic evidence of arteriovenous malformation, aneurysm, or dissection intra cranially or intracranially on this study. PLAN: Follow-up CT angiogram of head and neck in 2 months' time. Electronically Signed   By: Julieanne Cotton M.D.   On: 06/13/2022 08:01   IR ANGIO VERTEBRAL SEL VERTEBRAL BILAT MOD SED  Result Date: 06/13/2022 CLINICAL DATA:  Headaches. Nausea and vomiting. Right parietal cortical subcortical hematoma. EXAM: IR ANGIO INTRA EXTRACRAN SEL INTERNAL CAROTID BILAT MOD SED COMPARISON:  CT angiogram of the head and neck June 10, 2022, and MRI brain June 11, 2022. MEDICATIONS: Heparin 1000 units IV. No antibiotic was administered within 1 hour of the procedure. ANESTHESIA/SEDATION: Versed 1 mg IV; Fentanyl 12.5 mcg IV Moderate Sedation Time:  35 minutes The patient was continuously monitored during the procedure by the interventional radiology nurse under my direct supervision. CONTRAST:  Omnipaque 300 60 mL. FLUOROSCOPY TIME:  Fluoroscopy Time: 9 minutes 0 seconds (919 mGy). COMPLICATIONS: None immediate. TECHNIQUE: Informed written consent was obtained from the patient after a thorough discussion of the procedural risks, benefits and alternatives. All questions were addressed. Maximal Sterile Barrier Technique was utilized including caps, mask, sterile gowns, sterile gloves, sterile drape, hand hygiene and skin antiseptic. A timeout was performed prior to the initiation of the procedure. The right groin was prepped and draped  in the usual sterile fashion. Thereafter using modified Seldinger technique, transfemoral access into the right common femoral artery was obtained without difficulty. Over a 0.035 inch guidewire, a 5 French Pinnacle sheath was inserted. Through this, and also over 0.035 inch guidewire, a 5 [place] JB 1 catheter was advanced to the aortic arch region and selectively positioned in the right common carotid artery, the right vertebral artery, the left common carotid artery and the left vertebral artery. FINDINGS: The right vertebral artery origin is widely patent. Vessel opacifies to the cranial skull base. A kink is noted just distal to the posterior-inferior cerebellar artery origin due to intra dural entry of the left vertebrobasilar junction. Opacified portions of the basilar artery, the posterior cerebral arteries and the superior cerebellar arteries demonstrate opacification into the capillary and venous phases. The right common carotid arteriogram demonstrates the right external carotid artery and its major branches to be widely patent. Right internal carotid artery at the bulb to the cranial skull base is patent. The petrous, the cavernous and the supraclinoid segments demonstrate opacification. Right middle cerebral artery and the right anterior cerebral artery opacify into the capillary and venous phases. Mild focal areas of narrowing were noted involving the right pericallosal artery in its anterior half. Left common carotid arteriogram demonstrates the left  external carotid artery and its major branches to be patent. Left internal carotid artery to the cranial skull base is patent. The petrous, the cavernous and the supraclinoid segments demonstrate wide patency. The left middle cerebral artery and the left anterior cerebral artery opacify into the capillary and venous phases. Focal areas of smooth caliber narrowing interspersed with normal caliber of the parietal branch of the inferior division is noted.  Similar though less prominent similar changes are evident in the pericallosal A2 A3 segment. The dominant left vertebral artery origin is widely patent. The vessel is normal in caliber to the cranial skull base. Left vertebrobasilar junction, and the left posterior-inferior cerebellar artery normally patent. The basilar artery, the posterior cerebral arteries, the superior cerebellar arteries and the anterior-inferior cerebellar arteries opacify into the capillary and venous phases. IMPRESSION: Mild focal areas of smooth narrowing involving the pericallosal arteries bilaterally, slightly more prominent involving the posterior parietal branch of the inferior division of the left middle cerebral artery. No angiographic evidence of arteriovenous malformation, aneurysm, or dissection intra cranially or intracranially on this study. PLAN: Follow-up CT angiogram of head and neck in 2 months' time. Electronically Signed   By: Julieanne CottonSanjeev  Deveshwar M.D.   On: 06/13/2022 08:01   ECHOCARDIOGRAM COMPLETE  Result Date: 06/11/2022    ECHOCARDIOGRAM REPORT   Patient Name:   Shelley Harper Date of Exam: 06/11/2022 Medical Rec #:  [id number]                 Height:       63.0 in Accession #:    [id number]                Weight:       160.0 lb Date of Birth:  1970/09/09                 BSA:          1.759 m Patient Age:    51 years                  BP:           128/79 mmHg Patient Gender: F                         HR:           56 bpm. Exam Location:  Inpatient Procedure: 2D Echo, Cardiac Doppler and Color Doppler Indications:    Stroke I63.9  History:        Patient has no prior history of Echocardiogram examinations. No                 previous cardiac history.  Sonographer:    Leta Junglingiffany Cooper RDCS Referring Phys: [id number] [facility] AuthorityALMAN KHALIQDINA IMPRESSIONS  1. Left ventricular ejection fraction, by estimation, is 60 to 65%. The left ventricle has normal function. The left ventricle has no regional wall motion abnormalities.  Left ventricular diastolic parameters were normal.  2. Right ventricular systolic function is normal. The right ventricular size is normal. Tricuspid regurgitation signal is inadequate for assessing PA pressure.  3. The mitral valve is normal in structure. No evidence of mitral valve regurgitation. No evidence of mitral stenosis.  4. The aortic valve is normal in structure. Aortic valve regurgitation is not visualized. No aortic stenosis is present.  5. The inferior vena cava is normal in size with <50% respiratory variability, suggesting right atrial pressure of 8 mmHg. Conclusion(s)/Recommendation(s): Otherwise normal echocardiogram, with minor  abnormalities described in the report. FINDINGS  Left Ventricle: Left ventricular ejection fraction, by estimation, is 60 to 65%. The left ventricle has normal function. The left ventricle has no regional wall motion abnormalities. The left ventricular internal cavity size was normal in size. There is  no left ventricular hypertrophy. Left ventricular diastolic parameters were normal. Right Ventricle: The right ventricular size is normal. No increase in right ventricular wall thickness. Right ventricular systolic function is normal. Tricuspid regurgitation signal is inadequate for assessing PA pressure. Left Atrium: Left atrial size was normal in size. Right Atrium: Right atrial size was normal in size. Pericardium: There is no evidence of pericardial effusion. Presence of epicardial fat layer. Mitral Valve: The mitral valve is normal in structure. No evidence of mitral valve regurgitation. No evidence of mitral valve stenosis. Tricuspid Valve: The tricuspid valve is normal in structure. Tricuspid valve regurgitation is trivial. No evidence of tricuspid stenosis. Aortic Valve: The aortic valve is normal in structure. Aortic valve regurgitation is not visualized. No aortic stenosis is present. Pulmonic Valve: The pulmonic valve was normal in structure. Pulmonic valve  regurgitation is not visualized. No evidence of pulmonic stenosis. Aorta: The aortic root, ascending aorta and aortic arch are all structurally normal, with no evidence of dilitation or obstruction. Venous: The inferior vena cava is normal in size with less than 50% respiratory variability, suggesting right atrial pressure of 8 mmHg. IAS/Shunts: No atrial level shunt detected by color flow Doppler.  LEFT VENTRICLE PLAX 2D LVIDd:         4.40 cm   Diastology LVIDs:         2.70 cm   LV e' medial:    9.79 cm/s LV PW:         0.80 cm   LV E/e' medial:  11.0 LV IVS:        0.90 cm   LV e' lateral:   11.40 cm/s LVOT diam:     1.90 cm   LV E/e' lateral: 9.5 LV SV:         66 LV SV Index:   38 LVOT Area:     2.84 cm  RIGHT VENTRICLE RV S prime:     19.30 cm/s LEFT ATRIUM             Index        RIGHT ATRIUM           Index LA diam:        3.10 cm 1.76 cm/m   RA Area:     13.30 cm LA Vol (A2C):   40.7 ml 23.14 ml/m  RA Volume:   26.80 ml  15.24 ml/m LA Vol (A4C):   35.3 ml 20.07 ml/m LA Biplane Vol: 38.7 ml 22.00 ml/m  AORTIC VALVE LVOT Vmax:   123.00 cm/s LVOT Vmean:  84.000 cm/s LVOT VTI:    0.234 m  AORTA Ao Root diam: 2.60 cm Ao Asc diam:  2.50 cm MITRAL VALVE MV Area (PHT): 3.89 cm     SHUNTS MV Decel Time: 195 msec     Systemic VTI:  0.23 m MV E velocity: 108.00 cm/s  Systemic Diam: 1.90 cm MV A velocity: 59.70 cm/s MV E/A ratio:  1.81 Riley Lam MD Electronically signed by Riley Lam MD Signature Date/Time: 06/11/2022/2:40:13 PM    Final    MR BRAIN W WO CONTRAST  Result Date: 06/11/2022 CLINICAL DATA:  51 year old  female presenting with severe headache and vomiting, intra-axial hemorrhage in the posterior  right hemisphere yesterday. EXAM: MRI HEAD WITHOUT AND WITH CONTRAST TECHNIQUE: Multiplanar, multiecho pulse sequences of the brain and surrounding structures were obtained without and with intravenous contrast. CONTRAST:  7.3 mL Vueway COMPARISON:  Head CT 0355 hours today.   CTA/CTV yesterday. FINDINGS: Brain: Oval T1 hypointense to isointense and T2 hyperintense intra-axial hematoma in the posterior right hemisphere just below the right parieto-occipital sulcus encompasses 30 x 30 by 26 mm by MRI (AP by transverse by CC), estimated at 12 mL as on CT. Blood products susceptibility on SWI imaging. Following contrast, no abnormal enhancement. Mild regional edema. But no significant mass effect. No intraventricular extension or ventriculomegaly. Trace subarachnoid hemorrhage better demonstrated by CT. No larger area of diffusion abnormality there, blood product related susceptibility on DWI. No midline shift, and basilar cisterns remain normal. Cervicomedullary junction and pituitary are within normal limits. No dural thickening or enhancement. No definite chronic cortical encephalomalacia or chronic cerebral blood products. Deep gray nuclei, brainstem and cerebellum appear negative. Vascular: Major intracranial vascular flow voids are preserved. The distal left vertebral artery appears dominant. Major dural venous sinuses are enhancing and appear to be patent. Skull and upper cervical spine: Negative. Visualized bone marrow signal is within normal limits. Sinuses/Orbits: Negative orbits. Paranasal Visualized paranasal sinuses and mastoids are clear. Other: Visible internal auditory structures appear normal. Negative visible scalp and face. IMPRESSION: 1. Superior right occipital lobe intra-axial hematoma, just caudal to the parieto-occipital sulcus is stable measuring 12 mL. Mild regional edema with minimal associated mass effect. No evidence of underlying mass or lesion. Consider a follow-up MRI without and with contrast once the blood products have resolved. 2. Trace subarachnoid hemorrhage better demonstrated by CT. 3. Elsewhere negative MRI appearance of the brain. Electronically Signed   By: Odessa Fleming M.D.   On: 06/11/2022 08:24   CT HEAD WO CONTRAST ( )  Result Date:  06/11/2022 CLINICAL DATA:  51 year old female presenting with severe headache and vomiting, intra-axial hemorrhage in the posterior right hemisphere yesterday. Subsequent encounter. EXAM: CT HEAD WITHOUT CONTRAST TECHNIQUE: Contiguous axial images were obtained from the base of the skull through the vertex without intravenous contrast. RADIATION DOSE REDUCTION: This exam was performed according to the departmental dose-optimization program which includes automated exposure control, adjustment of the mA and/or kV according to patient size and/or use of iterative reconstruction technique. COMPARISON:  CT head, CTA and CTP yesterday. FINDINGS: Brain: Hyperdense intra-axial hemorrhage in the posterior right hemisphere centered near the right parieto-occipital sulcus encompasses 28 x 30 by 29 mm (AP by transverse by CC) versus 27 by 31 by 28 mm winds measured with the same technique yesterday. Extubated intra-axial volume is 12 mL, stable. Associated regional subarachnoid extension of blood is stable and relatively mild (sagittal image 21). Surrounding edema has not significantly changed. No intraventricular hemorrhage or ventriculomegaly. No significant intracranial mass effect and basilar cisterns remain normal. Stable gray-white matter differentiation elsewhere. Vascular: No suspicious intracranial vascular hyperdensity. Skull: Negative. Sinuses/Orbits: Visualized paranasal sinuses and mastoids are stable and well aerated. Other: No acute orbit or scalp soft tissue finding. IMPRESSION: 1. Stable intra-axial hemorrhage (approximately 12 mL) in the posterior right hemisphere centered near the right parieto-occipital sulcus. Stable surrounding edema and mild regional subarachnoid extension of blood. 2. No significant intracranial mass effect and no new intracranial abnormality. Electronically Signed   By: Odessa Fleming M.D.   On: 06/11/2022 04:14   CT VENOGRAM HEAD  Result Date: 06/10/2022 CLINICAL DATA:  Intracranial  hemorrhage EXAM: CT VENOGRAM HEAD  TECHNIQUE: Venographic phase images of the brain were obtained following the administration of intravenous contrast. Multiplanar reformats and maximum intensity projections were generated. RADIATION DOSE REDUCTION: This exam was performed according to the departmental dose-optimization program which includes automated exposure control, adjustment of the mA and/or kV according to patient size and/or use of iterative reconstruction technique. CONTRAST:  48mL OMNIPAQUE IOHEXOL 350 MG/ML SOLN COMPARISON:  None Available. FINDINGS: Unchanged size of right occipital intraparenchymal hematoma. Superior sagittal sinus: Normal. Straight sinus: Normal. Inferior sagittal sinus, vein of Galen and internal cerebral veins: Normal. Transverse sinuses: Normal. Sigmoid sinuses: Normal. Visualized jugular veins: Normal. IMPRESSION: 1. No evidence of dural venous sinus thrombosis. 2. Unchanged size of right occipital intraparenchymal hematoma. Electronically Signed   By: Ulyses Jarred M.D.   On: 06/10/2022 23:34   CT ANGIO HEAD NECK W WO CM  Result Date: 06/10/2022 CLINICAL DATA:  Syncopal event.  Severe headache and vomiting EXAM: CT ANGIOGRAPHY HEAD AND NECK TECHNIQUE: Multidetector CT imaging of the head and neck was performed using the standard protocol during bolus administration of intravenous contrast. Multiplanar CT image reconstructions and MIPs were obtained to evaluate the vascular anatomy. Carotid stenosis measurements (when applicable) are obtained utilizing NASCET criteria, using the distal internal carotid diameter as the denominator. RADIATION DOSE REDUCTION: This exam was performed according to the departmental dose-optimization program which includes automated exposure control, adjustment of the mA and/or kV according to patient size and/or use of iterative reconstruction technique. CONTRAST:  62mL OMNIPAQUE IOHEXOL 350 MG/ML SOLN COMPARISON:  Head CT 06/10/2022 FINDINGS:  Unchanged size of intraparenchymal hematoma centered in the right occipital lobe. CTA NECK FINDINGS SKELETON: There is no bony spinal canal stenosis. No lytic or blastic lesion. OTHER NECK: Normal pharynx, larynx and major salivary glands. No cervical lymphadenopathy. Unremarkable thyroid gland. UPPER CHEST: No pneumothorax or pleural effusion. No nodules or masses. AORTIC ARCH: There is no calcific atherosclerosis of the aortic arch. There is no aneurysm, dissection or hemodynamically significant stenosis of the visualized portion of the aorta. Conventional 3 vessel aortic branching pattern. The visualized proximal subclavian arteries are widely patent. RIGHT CAROTID SYSTEM: Normal without aneurysm, dissection or stenosis. LEFT CAROTID SYSTEM: Normal without aneurysm, dissection or stenosis. VERTEBRAL ARTERIES: Left dominant configuration. Both origins are clearly patent. There is no dissection, occlusion or flow-limiting stenosis to the skull base (V1-V3 segments). CTA HEAD FINDINGS POSTERIOR CIRCULATION: --Vertebral arteries: Normal V4 segments. --Inferior cerebellar arteries: Normal. --Basilar artery: Normal. --Superior cerebellar arteries: Normal. --Posterior cerebral arteries (PCA): Normal. ANTERIOR CIRCULATION: --Intracranial internal carotid arteries: Normal. --Anterior cerebral arteries (ACA): Normal. Both A1 segments are present. Patent anterior communicating artery (a-comm). --Middle cerebral arteries (MCA): Normal. VENOUS SINUSES: As permitted by contrast timing, patent. ANATOMIC VARIANTS: None Review of the MIP images confirms the above findings. IMPRESSION: 1. No emergent large vessel occlusion or high-grade stenosis of the intracranial arteries. 2. No aneurysm or vascular malformation. 3. Unchanged size of intraparenchymal hematoma centered in the right occipital lobe. Electronically Signed   By: Ulyses Jarred M.D.   On: 06/10/2022 23:32   CT Head Wo Contrast  Addendum Date: 06/10/2022   ADDENDUM  REPORT: 06/10/2022 22:25 ADDENDUM: These results were called by telephone at the time of interpretation on 06/10/2022 at 10:10 pm to provider Ellender Hose, MD, who verbally acknowledged these results. Electronically Signed   By: Fidela Salisbury M.D.   On: 06/10/2022 22:25   Result Date: 06/10/2022 CLINICAL DATA:  Head trauma, moderate-severe; Facial trauma, blunt; Neck trauma, midline tenderness (Age 20-64y). Headache,  vomiting. EXAM: CT HEAD WITHOUT CONTRAST CT MAXILLOFACIAL WITHOUT CONTRAST CT CERVICAL SPINE WITHOUT CONTRAST TECHNIQUE: Multidetector CT imaging of the head, cervical spine, and maxillofacial structures were performed using the standard protocol without intravenous contrast. Multiplanar CT image reconstructions of the cervical spine and maxillofacial structures were also generated. RADIATION DOSE REDUCTION: This exam was performed according to the departmental dose-optimization program which includes automated exposure control, adjustment of the mA and/or kV according to patient size and/or use of iterative reconstruction technique. COMPARISON:  None Available. FINDINGS: CT HEAD FINDINGS Brain: There is an acute intraparenchymal hematoma within the right occipital lobe measuring 2.1 x 2.8 x 2.7 cm (volume = 8.3 cm^3) with a small amount of surrounding cytotoxic edema. Small volume subarachnoid hemorrhage is seen within this region within the sulci of the right parietal and occipital lobes. Mild mass effect with effacement of the overlying sulci. No midline shift. Ventricular size is normal. Cerebellum is unremarkable. Vascular: No hyperdense vessel or unexpected calcification. Skull: Normal. Negative for fracture or focal lesion. Other: Mastoid air cells and middle ear cavities are clear CT MAXILLOFACIAL FINDINGS Osseous: No fracture or mandibular dislocation. No destructive process. Orbits: Negative. No traumatic or inflammatory finding. Sinuses: Clear. Soft tissues: Mild soft tissue swelling superficial  to the left zygomaticomaxillary suture. CT CERVICAL SPINE FINDINGS Alignment: Normal. Skull base and vertebrae: Craniocervical alignment is normal. The atlantodental interval is not widened. No acute fracture of the cervical spine. Vertebral body height is preserved. Soft tissues and spinal canal: No prevertebral fluid or swelling. No visible canal hematoma. Disc levels: Mild intervertebral disc space narrowing and endplate remodeling at C5-6 and C6-7 is present in keeping with changes of mild to moderate degenerative disc disease. Prevertebral soft tissues are not thickened on sagittal reformats. The spinal canal is widely patent. Asymmetric facet arthrosis results in severe right neuroforaminal narrowing at C3-4. Upper chest: Negative. Other: None IMPRESSION: 1. Acute intraparenchymal hematoma within the right occipital lobe with small volume surrounding cytotoxic edema and small volume subarachnoid hemorrhage. No midline shift. 2. No acute fracture or listhesis of the cervical spine. 3. No acute facial fracture. Mild soft tissue swelling superficial to the left zygomaticomaxillary suture. Electronically Signed: By: Helyn Numbers M.D. On: 06/10/2022 22:04   CT Cervical Spine Wo Contrast  Addendum Date: 06/10/2022   ADDENDUM REPORT: 06/10/2022 22:25 ADDENDUM: These results were called by telephone at the time of interpretation on 06/10/2022 at 10:10 pm to provider Erma Heritage, MD, who verbally acknowledged these results. Electronically Signed   By: Helyn Numbers M.D.   On: 06/10/2022 22:25   Result Date: 06/10/2022 CLINICAL DATA:  Head trauma, moderate-severe; Facial trauma, blunt; Neck trauma, midline tenderness (Age 1-64y). Headache, vomiting. EXAM: CT HEAD WITHOUT CONTRAST CT MAXILLOFACIAL WITHOUT CONTRAST CT CERVICAL SPINE WITHOUT CONTRAST TECHNIQUE: Multidetector CT imaging of the head, cervical spine, and maxillofacial structures were performed using the standard protocol without intravenous contrast.  Multiplanar CT image reconstructions of the cervical spine and maxillofacial structures were also generated. RADIATION DOSE REDUCTION: This exam was performed according to the departmental dose-optimization program which includes automated exposure control, adjustment of the mA and/or kV according to patient size and/or use of iterative reconstruction technique. COMPARISON:  None Available. FINDINGS: CT HEAD FINDINGS Brain: There is an acute intraparenchymal hematoma within the right occipital lobe measuring 2.1 x 2.8 x 2.7 cm (volume = 8.3 cm^3) with a small amount of surrounding cytotoxic edema. Small volume subarachnoid hemorrhage is seen within this region within the sulci of the right  parietal and occipital lobes. Mild mass effect with effacement of the overlying sulci. No midline shift. Ventricular size is normal. Cerebellum is unremarkable. Vascular: No hyperdense vessel or unexpected calcification. Skull: Normal. Negative for fracture or focal lesion. Other: Mastoid air cells and middle ear cavities are clear CT MAXILLOFACIAL FINDINGS Osseous: No fracture or mandibular dislocation. No destructive process. Orbits: Negative. No traumatic or inflammatory finding. Sinuses: Clear. Soft tissues: Mild soft tissue swelling superficial to the left zygomaticomaxillary suture. CT CERVICAL SPINE FINDINGS Alignment: Normal. Skull base and vertebrae: Craniocervical alignment is normal. The atlantodental interval is not widened. No acute fracture of the cervical spine. Vertebral body height is preserved. Soft tissues and spinal canal: No prevertebral fluid or swelling. No visible canal hematoma. Disc levels: Mild intervertebral disc space narrowing and endplate remodeling at C5-6 and C6-7 is present in keeping with changes of mild to moderate degenerative disc disease. Prevertebral soft tissues are not thickened on sagittal reformats. The spinal canal is widely patent. Asymmetric facet arthrosis results in severe right  neuroforaminal narrowing at C3-4. Upper chest: Negative. Other: None IMPRESSION: 1. Acute intraparenchymal hematoma within the right occipital lobe with small volume surrounding cytotoxic edema and small volume subarachnoid hemorrhage. No midline shift. 2. No acute fracture or listhesis of the cervical spine. 3. No acute facial fracture. Mild soft tissue swelling superficial to the left zygomaticomaxillary suture. Electronically Signed: By: Helyn Numbers M.D. On: 06/10/2022 22:04   CT Maxillofacial Wo Contrast  Addendum Date: 06/10/2022   ADDENDUM REPORT: 06/10/2022 22:25 ADDENDUM: These results were called by telephone at the time of interpretation on 06/10/2022 at 10:10 pm to provider Erma Heritage, MD, who verbally acknowledged these results. Electronically Signed   By: Helyn Numbers M.D.   On: 06/10/2022 22:25   Result Date: 06/10/2022 CLINICAL DATA:  Head trauma, moderate-severe; Facial trauma, blunt; Neck trauma, midline tenderness (Age 27-64y). Headache, vomiting. EXAM: CT HEAD WITHOUT CONTRAST CT MAXILLOFACIAL WITHOUT CONTRAST CT CERVICAL SPINE WITHOUT CONTRAST TECHNIQUE: Multidetector CT imaging of the head, cervical spine, and maxillofacial structures were performed using the standard protocol without intravenous contrast. Multiplanar CT image reconstructions of the cervical spine and maxillofacial structures were also generated. RADIATION DOSE REDUCTION: This exam was performed according to the departmental dose-optimization program which includes automated exposure control, adjustment of the mA and/or kV according to patient size and/or use of iterative reconstruction technique. COMPARISON:  None Available. FINDINGS: CT HEAD FINDINGS Brain: There is an acute intraparenchymal hematoma within the right occipital lobe measuring 2.1 x 2.8 x 2.7 cm (volume = 8.3 cm^3) with a small amount of surrounding cytotoxic edema. Small volume subarachnoid hemorrhage is seen within this region within the sulci of the  right parietal and occipital lobes. Mild mass effect with effacement of the overlying sulci. No midline shift. Ventricular size is normal. Cerebellum is unremarkable. Vascular: No hyperdense vessel or unexpected calcification. Skull: Normal. Negative for fracture or focal lesion. Other: Mastoid air cells and middle ear cavities are clear CT MAXILLOFACIAL FINDINGS Osseous: No fracture or mandibular dislocation. No destructive process. Orbits: Negative. No traumatic or inflammatory finding. Sinuses: Clear. Soft tissues: Mild soft tissue swelling superficial to the left zygomaticomaxillary suture. CT CERVICAL SPINE FINDINGS Alignment: Normal. Skull base and vertebrae: Craniocervical alignment is normal. The atlantodental interval is not widened. No acute fracture of the cervical spine. Vertebral body height is preserved. Soft tissues and spinal canal: No prevertebral fluid or swelling. No visible canal hematoma. Disc levels: Mild intervertebral disc space narrowing and endplate remodeling at C5-6  and C6-7 is present in keeping with changes of mild to moderate degenerative disc disease. Prevertebral soft tissues are not thickened on sagittal reformats. The spinal canal is widely patent. Asymmetric facet arthrosis results in severe right neuroforaminal narrowing at C3-4. Upper chest: Negative. Other: None IMPRESSION: 1. Acute intraparenchymal hematoma within the right occipital lobe with small volume surrounding cytotoxic edema and small volume subarachnoid hemorrhage. No midline shift. 2. No acute fracture or listhesis of the cervical spine. 3. No acute facial fracture. Mild soft tissue swelling superficial to the left zygomaticomaxillary suture. Electronically Signed: By: Helyn Numbers M.D. On: 06/10/2022 22:04   DG Chest Port 1 View  Result Date: 06/10/2022 CLINICAL DATA:  Syncope, intracranial hemorrhage EXAM: PORTABLE CHEST 1 VIEW COMPARISON:  None Available. FINDINGS: The heart size and mediastinal contours  are within normal limits. Both lungs are clear. The visualized skeletal structures are unremarkable. IMPRESSION: No active disease. Electronically Signed   By: Helyn Numbers M.D.   On: 06/10/2022 22:13      HISTORY OF PRESENT ILLNESS Shelley Harper is a 51 y.o. female with no significant past medical history who reports sudden onset thunderclap headache at 1730 on 06/10/2022 while she was at the gym exercising. She almost blacked out and fell but did not hit her head. She had an identical episode on Friday, 06/07/2022 which happened while she was again exercising.  The headache however, went away spontaneously on Friday but is persistent 9/10 today. She came into the ED where CT head demonstrated acute intraparenchymal hematoma within the right occipital lobe with small volume cytotoxic edema and small volume subarachnoid hemorrhage.  She denies any anticoagulation or antiplatelet agents.  She does not use any recreational substances.  No prior history of similar episodes.  Reports her father had stroke in 50s.   She has implantable birth control that is due for replacement.   Husband reports that she had a similar headache  LKW: 1730 on 06/10/2022. mRS: 0 ICH score: 0 tNKASE: Not offered due to ICH. Thrombectomy: Not offered due to ICH.   HOSPITAL COURSE Shelley Harper is a 51 y.o. female with no significant past medical history who reports sudden onset thunderclap headache at 1730 on 06/10/2022 while she was at the gym exercising.    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  Cerebral  angiogram shows smooth focal areas of moderate narrowing involving the right anterior cerebral artery pericallosal branch, and posterior parietal branch of the left middle cerebral artery. 2D Echo EF 60-65% LDL 82 HgbA1c 5.8 VTE prophylaxis - lovenox On cardizem XR 120 Therapy recommendations:  outpt therapy Disposition:  home   HA Was on tramadol  Q6H PRN Increased Topamax from 25 mg to 50 mg twice daily Was on Phenergan 12.5mg  Q6H PRN for nausea/vomiting  Hypertension Home meds:  none Stable Long-term BP goal normotensive Continue Cardizem SR 120 mg daily   Other Stroke Risk Factors Family hx stroke (father)   Other Active Problems Left lower quadrantanopia still present. Recommend no driving until followed up and cleared by ophthalmology. Pt has ophthalmologist and will follow up closely  DISCHARGE EXAM Blood pressure (!) 134/95, pulse 71, temperature 98 F (36.7 C), temperature source Oral, resp. rate 16, height 5\' 3"  (1.6 m), weight 72.6 kg, SpO2 97 %.  General: Pleasant, well-appearing  in bed. No acute distress. Psych: Normal mood and affect Skin: Warm and dry. No obvious rash or lesions. Neuro: A&Ox3. CN III-XII intact. No cerebellar ataxia. Normal sensation. No focal deficit. No dysarthria. Left lower quadrantanopia still present. Normal ROM. 5/5 strength in RUE, LUE, RLE, and LLE.   Discharge Diet       Diet   Diet Heart Room service appropriate? Yes; Fluid consistency: Thin   liquids  DISCHARGE PLAN Disposition:  home BP monitoring at home Ongoing stroke risk factor control by Primary Care Physician at time of discharge Follow-up PCP , MD in 2 weeks. Follow-up with Dr. Barbette Reichmann in [facility] in 4 weeks, office to schedule an appointment.  Follow up with ophthalmology closely. No driving until cleared by ophthal  40 minutes were spent preparing discharge.  [facility], MD PhD Stroke  Neurology 06/15/2022 12:27 PM

## 2022-06-15 NOTE — Progress Notes (Signed)
Physical Therapy Treatment Patient Details Name: Shelley Harper MRN: [id number] DOB: 1971/06/13 Today's Date: 06/15/2022   History of Present Illness Shelley Harper is a 51 y.o. female who reports sudden onset thunderclap headache on 06/10/2022 while she was at the gym exercising. CT head demonstrated acute intraparenchymal hematoma within the right occipital lobe with small volume cytotoxic edema and small volume subarachnoid hemorrhage. No significant past medical hx.    PT Comments    Pt is demonstrating improved stability and speed with mobility, but continues to demonstrate some mild balance deficits that place her at risk for falls. This is supported by her DGI score of 19/24 today (</= 19/24 is indicative of falls). While she does have moments of imbalance, she appears to be very aware of her deficits and reacts/adjusts herself to accommodate for these deficits to maintain her balance. She did have x1 posterior LOB when trying to descend stairs with a reciprocal step pattern without UE support, thus educated pt and her husband to use the rails if available and if not then to perform step-to pattern and have someone guard her for safety. They verbalized understanding. Will continue to follow acutely. Current recommendations remain appropriate.     Recommendations for follow up therapy are one component of a multi-disciplinary discharge planning process, led by the attending physician.  Recommendations may be updated based on patient status, additional functional criteria and insurance authorization.  Follow Up Recommendations  Outpatient PT     Assistance Recommended at Discharge Intermittent Supervision/Assistance  Patient can return home with the following A little help with bathing/dressing/bathroom;Assistance with cooking/housework;Assist for transportation;Help with stairs or ramp for entrance   Equipment Recommendations  None recommended by PT     Recommendations for Other Services       Precautions / Restrictions Precautions Precautions: Fall Precaution Comments: watch BP Restrictions Weight Bearing Restrictions: No     Mobility  Bed Mobility               General bed mobility comments: sitting up in recliner upon arrival    Transfers Overall transfer level: Needs assistance Equipment used: None Transfers: Sit to/from Stand Sit to Stand: Supervision           General transfer comment: Supervision for safety to ensure pt did not get dizzy, which she did not    Ambulation/Gait Ambulation/Gait assistance: Min guard, Supervision Gait Distance (Feet): 350 Feet Assistive device: None Gait Pattern/deviations: Step-through pattern, Decreased stride length, Narrow base of support Gait velocity: reduced Gait velocity interpretation: >2.62 ft/sec, indicative of community ambulatory   General Gait Details: Pt with improved gait speed and fluidity/stability today, but still slow and guarded compared to her normal. Pt with intermittent narrow steps when her dynamic gait balance was challenged, but she would recover with reactional steps and no need for physical assist. Min guard-supervision for safety   Stairs Stairs: Yes Stairs assistance: Min guard, Min assist Stair Management: One rail Left, Step to pattern, Forwards, No rails, Alternating pattern Number of Stairs: 12 General stair comments: Ascends without rail with reciprocal stepping, no LOB but slowed and effortful, min guard for safety. Began descending with no rails and attempted reciprocal steps but noted LOB posteriorly, needing minA to recover. Pt then performing step-to without rail with min guard and then L rail utilization with reciprocal stepping with min guard. Educated pt and husband to use rails if available and step-to when descending if rails are not available. Guarding recommended on stairs if no rail  available, otherwise can be  supervision.   Wheelchair Mobility    Modified Rankin (Stroke Patients Only) Modified Rankin (Stroke Patients Only) Pre-Morbid Rankin Score: No symptoms Modified Rankin: Slight disability     Balance Overall balance assessment: Needs assistance Sitting-balance support: No upper extremity supported, Feet supported Sitting balance-Leahy Scale: Normal     Standing balance support: No upper extremity supported, During functional activity Standing balance-Leahy Scale: Good Standing balance comment: Able to ambulate without UE support but LOB when trying to descend stairs without UE support, minA to recover.                 Standardized Balance Assessment Standardized Balance Assessment : Dynamic Gait Index   Dynamic Gait Index Level Surface: Mild Impairment Change in Gait Speed: Normal Gait with Horizontal Head Turns: Mild Impairment Gait with Vertical Head Turns: Mild Impairment Gait and Pivot Turn: Normal Step Over Obstacle: Normal Step Around Obstacles: Mild Impairment Steps: Mild Impairment Total Score: 19      Cognition Arousal/Alertness: Awake/alert Behavior During Therapy: WFL for tasks assessed/performed Overall Cognitive Status: Within Functional Limits for tasks assessed                                          Exercises      General Comments General comments (skin integrity, edema, etc.): BP: 158/96 (114) sitting start of session, 166/116 (133) while ambulating, 180/88 (116) sitting end of session after ambulating/stairs      Pertinent Vitals/Pain Pain Assessment Pain Assessment: No/denies pain    Home Living                          Prior Function            PT Goals (current goals can now be found in the care plan section) Acute Rehab PT Goals Patient Stated Goal: to get back to normal PT Goal Formulation: With patient/family Time For Goal Achievement: 06/27/22 Potential to Achieve Goals: Good Progress  towards PT goals: Progressing toward goals    Frequency    Min 4X/week      PT Plan Current plan remains appropriate    Co-evaluation              AM-PAC PT "6 Clicks" Mobility   Outcome Measure  Help needed turning from your back to your side while in a flat bed without using bedrails?: None Help needed moving from lying on your back to sitting on the side of a flat bed without using bedrails?: None Help needed moving to and from a bed to a chair (including a wheelchair)?: A Little Help needed standing up from a chair using your arms (e.g., wheelchair or bedside chair)?: A Little Help needed to walk in hospital room?: A Little Help needed climbing 3-5 steps with a railing? : A Little 6 Click Score: 20    End of Session Equipment Utilized During Treatment: Gait belt Activity Tolerance: Patient tolerated treatment well Patient left: with family/visitor present;in chair Nurse Communication: Mobility status;Other (comment) (BP) PT Visit Diagnosis: Unsteadiness on feet (R26.81);Other abnormalities of gait and mobility (R26.89);Difficulty in walking, not elsewhere classified (R26.2);Other symptoms and signs involving the nervous system (R29.898);Dizziness and giddiness (R42)     Time: 6761-9509 PT Time Calculation (min) (ACUTE ONLY): 11 min  Charges:  $Gait Training: 8-22 mins  Raymond Gurney, PT, DPT Acute Rehabilitation Services  Office: [phone]    Shelley Harper 06/15/2022, 10:13 AM

## 2022-06-18 ENCOUNTER — Telehealth: Payer: Self-pay

## 2022-06-18 NOTE — Patient Outreach (Signed)
  Emmi Stroke Care Coordination Follow Up  06/18/2022 Name:  Savon Cobbs MRN:  [id number] DOB:  17-May-1971  Subjective: Adalena Ori Kreiter is a 51 y.o. year old female who is a primary care patient of Tracie Harrier, MD   An Emmi alert was received indicating patient responded to questions: Scheduled a follow-up appointment?.    I reached out by phone to follow up on the alert and spoke to Caregiver/spouse. He reports that patient is doing well. Denies any acute issues or concerns at present. Reviewed and addressed red alert. Spouse confirms appts have been scheduled. Patient goes for outpt rehab appt tomorrow. She has neuro MD appt on 07/16/22. Spouse states he plans to call PCP office today to schedule follow up appt. Denies any issues with transportation or meds. No further RN CM needs or concerns at this time.   Care Coordination Interventions Activated:  Yes  Care Coordination Interventions:  Yes, provided   Follow up plan: No further intervention required. Advised caregiver that they would continue to get automated EMMI-Stroke post discharge calls to assess how they are doing following recent hospitalization and will receive a call from a nurse if any of their responses were abnormal.   Encounter Outcome:  Pt. Visit Completed   Enzo Montgomery, RN,BSN,CCM [facility] Management Coordinator Direct Phone: [phone] Toll Free: [phone] Fax: [phone]

## 2022-06-18 NOTE — Patient Outreach (Signed)
Received a red flag Emmi stroke notification for Ms. Ploeger . I have assigned Roshanda Florance, RN to call for follow up and determine if there are any Case Management needs.    Cordero Surette, CBCS, CMAA [facility] Care Management Assistant [facility] Care Management [phone]   

## 2022-06-19 ENCOUNTER — Ambulatory Visit: Payer: Managed Care, Other (non HMO) | Attending: Neurology

## 2022-06-19 DIAGNOSIS — H53462 Homonymous bilateral field defects, left side: Secondary | ICD-10-CM | POA: Diagnosis present

## 2022-06-19 DIAGNOSIS — I619 Nontraumatic intracerebral hemorrhage, unspecified: Secondary | ICD-10-CM | POA: Diagnosis present

## 2022-06-19 NOTE — Therapy (Signed)
OUTPATIENT OCCUPATIONAL THERAPY NEURO EVALUATION  Patient Name: Shelley Harper MRN: [id number] DOB:[birth year], 51 y.o., female Today's Date: 06/19/2022  PCP: Dr. Tracie Harrier REFERRING PROVIDER: Dr. Antony Contras    OT End of Session - 06/20/22 2308     Visit Number 1    Number of Visits 1    OT Start Time 5397    OT Stop Time 1550    OT Time Calculation (min) 35 min    Activity Tolerance Patient tolerated treatment well    Behavior During Therapy [facility] for tasks assessed/performed             Past Medical History:  Diagnosis Date   No known health problems    Past Surgical History:  Procedure Laterality Date   IR ANGIO INTRA EXTRACRAN SEL INTERNAL CAROTID BILAT MOD SED  06/12/2022   IR ANGIO VERTEBRAL SEL VERTEBRAL BILAT MOD SED  06/12/2022   NO PAST SURGERIES     Patient Active Problem List   Diagnosis Date Noted   ICH (intracerebral hemorrhage) ([place]) 06/11/2022    ONSET DATE: 05/12/22  REFERRING DIAG: Right-sided nontraumatic intracerebral hemorrhage, unspecified cerebral location ([place])  THERAPY DIAG:  No diagnosis found. 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 Pt accompanied by: spouse, Vipul  PERTINENT HISTORY: Shanley Shanyiah Conde is a 51 y.o. female with no significant past medical history who reports sudden onset thunderclap headache at 1730 on 06/10/2022 while she was at the gym exercising. She almost blacked out and fell but did not hit her head. She had an identical episode on Friday, 06/07/2022 which happened while she was again exercising.  The headache however, went away spontaneously on Friday but is persistent 9/10 today. She came into the ED where CT head demonstrated acute intraparenchymal hematoma within the right  occipital lobe with small volume cytotoxic edema and small volume subarachnoid hemorrhage.   PRECAUTIONS: Other: no driving until cleared by opthalmology   WEIGHT BEARING RESTRICTIONS No  PAIN:  Are you having pain? No  FALLS: Has patient fallen in last 6 months? No  LIVING ENVIRONMENT: Lives with: lives with their familylives with spouse and mother and father in law Lives in: House/apartment 2 level home Stairs: 10 steps, 1 rail Has following equipment at home: None  PLOF: Independent , worked full time at [employer] as a Designer, fashion/clothing  PATIENT GOALS : return to baseline with vision  OBJECTIVE:   HAND DOMINANCE: Right  ADLs: Overall ADLs: spouse supervised shower initially coming home from the hospital, but pt reports now managing fine Transfers/ambulation related to ADLs: indep in the home, spouse holds pt's arm in unfamiliar environments Eating: indep Grooming: indep UB Dressing: indep LB Dressing: indep Toileting: indep Bathing: indep Tub Shower transfers: indep Equipment: none   IADLs: Pt and family manage IADLs together.  Pt not yet driving.  MOBILITY STATUS:  pt walks indep in the home without AD.  Spouse holds pt's arm in unfamiliar environments d/t pt's visual deficit  POSTURE COMMENTS:  No Significant postural limitations Sitting balance: good/WNL  ACTIVITY TOLERANCE: Activity tolerance: Good for activities assessed  UPPER EXTREMITY ROM: BUEs WNL   UPPER EXTREMITY MMT: BUEs 5/5   HAND FUNCTION: Grip strength: Right: 52 lbs; Left: 59 lbs, Lateral pinch: Right: 18 lbs, Left: 16 lbs, and 3 point  pinch: Right: 21 lbs, Left: 19 lbs  COORDINATION: 9 Hole Peg test: Right: 24 sec; Left: 23 sec  SENSATION: WFL  EDEMA: none  MUSCLE TONE: normal  COGNITION: Overall cognitive status: Within functional limits for tasks assessed  VISION: Subjective report: Pt reports not being able to see in her L lower quadrant in the L eye Baseline vision:  Wears contacts Visual history:  Recent R sided  occipital small ICH post aggressive exercise, quadrantanopia   VISION ASSESSMENT: Ocular ROM: WFL Tracking/Visual pursuits: Pt able to track to all 4 quadrants, but states vision in the L lower quadrant is blurry Saccades: WFL Convergence: WFL Visual Fields: L lower quadrant deficit Depth perception: WNL  Patient has difficulty with following activities due to following visual impairments: pt has learned to compensate for visual loss in the L lower quadrant with head turns.   PERCEPTION: WFL  PRAXIS: WFL  TODAY'S TREATMENT:  Evaluation completed.  Objective measured taken.    PATIENT EDUCATION: Education details: head turns to compensate for visual field deficit Person educated: Patient and Spouse Education method: Explanation Education comprehension: verbalized understanding and returned demonstration   HOME EXERCISE PROGRAM: N/A   GOALS: Goals reviewed with patient? Yes  SHORT TERM GOALS: Target date: 06/19/22  Pt will verbalize understanding of compensatory strategies as they relate to her visual field deficit to safely negotiate her environment in and outside the home.  Baseline: reviewed at eval; spouse confirms pt is consistent with fall prevention strategies and use of head turns to scan her environment Goal status: MET  2.  Pt will be indep with BADLs. Baseline: indep Goal status: MET   ASSESSMENT:  CLINICAL IMPRESSION: Patient is a 51 y.o. female who was seen today for occupational therapy evaluation for visual deficit following R occipital small ICH.  OT reviewed precautions with visual loss in the L lower quadrant as they can relate to driving or when ambulating in an unfamiliar environment.  Pt acknowledged that she will continue to refrain from driving until cleared by her eye doctor, whom she will see next week.  Pt acknowledged making sure to use frequent head turns to look out for curbs, uneven ground, etc  when negotiating unfamiliar environments in order to reduce her fall risk.  Spouse provides supv on stairs in the home and pt uses the handrails at home to reduce fall risk.  Spouse confirms that pt is consistent with these compensatory strategies and pt acknowledges no additional challenges with her daily routine.  No additional skilled OT indicated at this time.  Pt/spouse in agreement with plan.   PERFORMANCE DEFICITS in functional skills including vision  IMPAIRMENTS are limiting patient from work and driving .   COMORBIDITIES has no other co-morbidities that affects occupational performance.   MODIFICATION OR ASSISTANCE TO COMPLETE EVALUATION: No modification of tasks or assist necessary to complete an evaluation.  OT OCCUPATIONAL PROFILE AND HISTORY: Problem focused assessment: Including review of records relating to presenting problem.  CLINICAL DECISION MAKING: LOW - limited treatment options, no task modification necessary  REHAB POTENTIAL: N/A (eval only)  EVALUATION COMPLEXITY: Low    PLAN: OT FREQUENCY: one time visit  OT DURATION: 1 week  PLANNED INTERVENTIONS: visual/perceptual remediation/compensation  RECOMMENDED OTHER SERVICES: Pt will follow up with ophthalmology next week  CONSULTED AND AGREED WITH PLAN OF CARE: Patient and family member/caregiver  PLAN FOR NEXT SESSION: N/A; eval only   Leta Speller, MS, OTR/L  Darleene Cleaver, OT 06/19/2022, 3:25 PM

## 2022-06-20 NOTE — Patient Outreach (Signed)
Received a red flag Emmi stroke notification for Ms. Cleary . I have assigned Enzo Montgomery, RN to call for follow up and determine if there are any Case Management needs.    Arville Care, [place], [place] Management [phone]

## 2022-06-28 NOTE — Progress Notes (Signed)
Kindly inform the patient that lab work for abnormal clotting was fine.  Nothing to worry about

## 2022-07-03 DIAGNOSIS — R7309 Other abnormal glucose: Secondary | ICD-10-CM | POA: Insufficient documentation

## 2022-07-16 ENCOUNTER — Telehealth: Payer: Self-pay | Admitting: Neurology

## 2022-07-16 ENCOUNTER — Encounter: Payer: Self-pay | Admitting: Neurology

## 2022-07-16 ENCOUNTER — Ambulatory Visit (INDEPENDENT_AMBULATORY_CARE_PROVIDER_SITE_OTHER): Payer: Managed Care, Other (non HMO) | Admitting: Neurology

## 2022-07-16 VITALS — BP 121/80 | HR 63 | Ht 63.0 in | Wt 160.0 lb

## 2022-07-16 DIAGNOSIS — I67848 Other cerebrovascular vasospasm and vasoconstriction: Secondary | ICD-10-CM | POA: Diagnosis not present

## 2022-07-16 DIAGNOSIS — G441 Vascular headache, not elsewhere classified: Secondary | ICD-10-CM

## 2022-07-16 DIAGNOSIS — I611 Nontraumatic intracerebral hemorrhage in hemisphere, cortical: Secondary | ICD-10-CM

## 2022-07-16 MED ORDER — TOPIRAMATE 25 MG PO TABS: 25.0000 mg | ORAL_TABLET | Freq: Two times a day (BID) | ORAL | 0 refills | Status: DC

## 2022-07-16 NOTE — Telephone Encounter (Signed)
[employer] sent to GI they obtain auth [id number] 

## 2022-07-16 NOTE — Progress Notes (Signed)
[facility] [address] [place]. [place] [address] [phone]       OFFICE FOLLOW-UP NOTE  [name] Date of Birth:  25-Jan-1971 Medical Record Number:  [id number]   HPI: Shelley Harper is a pleasant 51 year old [place] origin lady seen today for initial office follow-up visit following hospital admission for intracerebral hemorrhage in October 2023.  She is accompanied by her husband today and history is obtained from them and review of electronic medical records and I personally reviewed pertinent available imaging films in PACS.  She has no significant past medical history who developed sudden onset of thunderclap headache on 06/10/2022 at 1730 hrs. while she was at the gym exercising.  She is almost blacked out and fell but did not hit her head.  She stated that she had somewhat similar but milder episode on 06/07/2022 also while she was exercising.  Had a headache however went away spontaneously but the present headache persisted and was severe 9/10 with nausea ER for evaluation.  CT head demonstrated acute right occipital hematoma with mild amount of cytotoxic edema and small subarachnoid hemorrhage.  Neurological exam was nonfocal during his admission he was kept in the ICU blood pressure tightly controlled.  She was significantly sick with nausea and headache had to be started on initially Tylenol with codeine and subsequently tramadol and Topamax for headaches.  CT angiogram of the head and neck showed no large vessel stenosis, aneurysms or AVMs.  Repeat CT scan shows stable appearance of the parenchymal hemorrhage with mild cytotoxic edema but no hydrocephalus.  MRI scan confirmed a right superior occipital lobe intra-axial hematoma volume of 12 mm.  Diagnostic cerebral catheter angiogram was performed which showed multifocal areas of moderate narrowing involving the right anterior cerebral artery pericallosal branch and posterior parietal branch of  middle cerebral artery.  Findings of unclear significance.  2D echo showed ejection fraction of 60 to 65%.  LDL cholesterol was 82 mg percent and hemoglobin A1c was 5.8.  Patient was started on Cardizem XR today as well.  She has resumed all her r activities at home without restrictions.  She has no visual complaints and wants to drive.  She was seen by ophthalmologist recently medically cleared but I do not have those records.Patient has difficulty with following activities due to following visual impairments: Pt has learned to compensate for visual loss in the L lower quadrant with head turns.  On bedside confrontational visual testing today I did not see any deficits      ROS:   14 system review of systems is positive for headache, nausea and all other systems negative  PMH:  Past Medical History:  Diagnosis Date   No known health problems     Social History:  Social History   Socioeconomic History   Marital status: Married    Spouse name: Not on file   Number of children: Not on file   Years of education: Not on file   Highest education level: Not on file  Occupational History   Not on file  Tobacco Use   Smoking status: Never   Smokeless tobacco: Never  Vaping Use   Vaping Use: Never used  Substance and Sexual Activity   Alcohol use: Never   Drug use: Never   Sexual activity: Yes    Partners: Male    Birth control/protection: I.U.D.    Comment: Mirena  Other Topics Concern   Not on file  Social History Narrative  Not on file   Social Determinants of Health   Financial Resource Strain: Not on file  Food Insecurity: Not on file  Transportation Needs: Not on file  Physical Activity: Not on file  Stress: Not on file  Social Connections: Not on file  Intimate Partner Violence: Not on file    Medications:   Current Outpatient Medications on File Prior to Visit  Medication Sig Dispense Refill   levonorgestrel (MIRENA) 20 MCG/24HR IUD 1 each by Intrauterine route  once.     Vitamin D, Ergocalciferol, (DRISDOL) 1.25 MG (50000 UNIT) CAPS capsule Take 50,000 Units by mouth once a week. Sundays     No current facility-administered medications on file prior to visit.    Allergies:   Allergies  Allergen Reactions   Beef-Derived Products    Chicken Protein    Fish-Derived Products    Pork-Derived Products     Physical Exam General: well developed, well nourished pleasant middle-age lady, seated, in no evident distress Head: head normocephalic and atraumatic.  Neck: supple with no carotid or supraclavicular bruits Cardiovascular: regular rate and rhythm, no murmurs Musculoskeletal: no deformity Skin:  no rash/petichiae Vascular:  Normal pulses all extremities Vitals:   07/16/22 1458  BP: 121/80  Pulse: 63   Neurologic Exam Mental Status: Awake and fully alert. Oriented to place and time. Recent and remote memory intact. Attention span, concentration and fund of knowledge appropriate. Mood and affect appropriate.  Cranial Nerves: Fundoscopic exam reveals sharp disc margins. Pupils equal, briskly reactive to light. Extraocular movements full without nystagmus. Visual fields full to confrontation. Hearing intact. Facial sensation intact. Face, tongue, palate moves normally and symmetrically.  Motor: Normal bulk and tone. Normal strength in all tested extremity muscles. Sensory.: intact to touch ,pinprick .position and vibratory sensation.  Coordination: Rapid alternating movements normal in all extremities. Finger-to-nose and heel-to-shin performed accurately bilaterally. Gait and Station: Arises from chair without difficulty. Stance is normal. Gait demonstrates normal stride length and balance . Able to heel, toe and tandem walk without difficulty.  Reflexes: 1+ and symmetric. Toes downgoing.   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  She is done well with resolution of her headache     PLAN: I had a long discussion with the patient and her husband regarding her intracerebral hemorrhage and possibility of reversible cerebral vasoconstriction syndrome and answered questions.  He is doing very well with resolution of her headaches.  Recommend she discontinue Topamax  and  verapamil at the present time.  Recommend repeat MRI scan of the brain with and without contrast and MR angiogram of the brain in 1 month from now.  She was advised to increase her physical activity gradually as tolerated.  Can begin driving as well but I advised her to increase gradually as tolerated and return to work after the holidays.  She was advised to discuss with her gynecologist whether she needed to replace Mirena IUD or use nonestrogen alternatives.. She will return for follow-up to see me in 6 months or call earlier if necessary. Greater than 50% of time during this 35 minute visit was spent on counseling,explanation of diagnosis of intracerebral hemorrhage and RCVS, planning of further management, discussion with patient and family and coordination of care Delia Heady, MD  This note was created using  digital dictation and possible smart phrase technology.  Any transcriptional errors that result from this process are unintentional

## 2022-07-16 NOTE — Patient Instructions (Addendum)
I had a long discussion with the patient and her husband regarding her intracerebral hemorrhage and possibility of reversible cerebral vasoconstriction syndrome and answered questions.  He is doing very well with resolution of her headaches.  Recommend she discontinue Topamax  and  verapamil at the present time.  Recommend repeat MRI scan of the brain with and without contrast and MR angiogram of the brain in 1 month from now.  She was advised to increase her physical activity gradually as tolerated.  Can begin driving as well and return to work after the holidays. She was advised to discuss with her gynecologist whether she needed to replace Mirena IUD or use nonestrogen alternatives. Return for follow-up to see me in 6 months or call earlier if necessary.

## 2022-07-17 ENCOUNTER — Telehealth: Payer: Self-pay | Admitting: *Deleted

## 2022-07-17 DIAGNOSIS — Z0289 Encounter for other administrative examinations: Secondary | ICD-10-CM

## 2022-07-17 NOTE — Telephone Encounter (Signed)
I faxed pt [employer] form on 07/17/2022 to [phone]

## 2022-08-14 ENCOUNTER — Ambulatory Visit
Admission: RE | Admit: 2022-08-14 | Discharge: 2022-08-14 | Disposition: A | Discharge status: Home / Self Care | Payer: Managed Care, Other (non HMO) | Source: Ambulatory Visit | Attending: Neurology | Admitting: Neurology

## 2022-08-14 DIAGNOSIS — I610 Nontraumatic intracerebral hemorrhage in hemisphere, subcortical: Secondary | ICD-10-CM

## 2022-08-14 DIAGNOSIS — I67848 Other cerebrovascular vasospasm and vasoconstriction: Secondary | ICD-10-CM

## 2022-08-14 DIAGNOSIS — I611 Nontraumatic intracerebral hemorrhage in hemisphere, cortical: Secondary | ICD-10-CM

## 2022-08-14 MED ORDER — GADOPICLENOL 0.5 MMOL/ML IV SOLN
7.5000 mL | Freq: Once | INTRAVENOUS | Status: AC | PRN
  Administered 2022-08-14: 7.5 mL via INTRAVENOUS

## 2022-08-15 ENCOUNTER — Ambulatory Visit: Payer: Managed Care, Other (non HMO) | Admitting: Obstetrics and Gynecology

## 2022-08-15 ENCOUNTER — Encounter: Payer: Managed Care, Other (non HMO) | Admitting: Occupational Therapy

## 2022-08-24 NOTE — Progress Notes (Signed)
Kindly inform the patient that MR angiogram study of the blood vessels in the brain shows no abnormalities.

## 2022-08-24 NOTE — Progress Notes (Signed)
Can inform the patient that follow-up MRI scan of the brain shows satisfactory improvement changes in the recent brain hemorrhage.  No new or worrisome findings.

## 2022-09-10 ENCOUNTER — Encounter: Payer: Managed Care, Other (non HMO) | Admitting: Occupational Therapy

## 2022-09-12 ENCOUNTER — Encounter: Payer: Managed Care, Other (non HMO) | Admitting: Occupational Therapy

## 2022-09-19 ENCOUNTER — Encounter: Payer: Managed Care, Other (non HMO) | Admitting: Occupational Therapy

## 2022-09-22 NOTE — Progress Notes (Signed)
   Chief Complaint  Patient presents with   IUD removal    Not interested in [facility]     History of Present Illness:  Shelley Harper is a 52 y.o. that had a Mirena IUD placed 10/15 for menorrhagia. Pt is amenorrheic, has tolerable vasomotor sx; not interested in other [facility]. She is sexually active, no pain/bleeding. Pt is s/p intracerebral hemorrhage 10/23 with unknown etiology. Pt's neuro wanted pt to f/u with GYN for either IUD replacement vs non-estrogen BC options.  Neg pap/neg HPV DNA 07/19/19  Patient Active Problem List   Diagnosis Date Noted   Elevated hemoglobin A1c 07/03/2022   ICH (intracerebral hemorrhage) ([place]) 06/11/2022   Anemia 01/12/2020   Other fatigue 01/12/2020    Past Surgical History:  Procedure Laterality Date   IR ANGIO INTRA EXTRACRAN SEL INTERNAL CAROTID BILAT MOD SED  06/12/2022   IR ANGIO VERTEBRAL SEL VERTEBRAL BILAT MOD SED  06/12/2022   Family History  Problem Relation Age of Onset   Breast cancer Maternal Grandmother 44   Review of Systems  Constitutional:  Negative for fever, malaise/fatigue and weight loss.  Gastrointestinal:  Negative for blood in stool, constipation, diarrhea, nausea and vomiting.  Genitourinary:  Negative for dysuria, flank pain, frequency, hematuria and urgency.  Musculoskeletal:  Negative for back pain.  Skin:  Negative for itching and rash.    BP 110/60   Ht 5\' 3"  (1.6 m)   Wt 163 lb (73.9 kg)   BMI 28.87 kg/m   Physical Exam Constitutional:      General: She is not in acute distress. Genitourinary:     Vulva normal.     Right Labia: No rash, tenderness or lesions.    Left Labia: No tenderness, lesions or rash.    No vaginal discharge, erythema, tenderness or bleeding.      Right Adnexa: not tender and no mass present.    Left Adnexa: not tender and no mass present.    No cervical motion tenderness or friability.     IUD strings visualized.     Uterus is not enlarged or tender.  Pulmonary:     Effort:  Pulmonary effort is normal.  Musculoskeletal:        General: Normal range of motion.  Neurological:     General: No focal deficit present.     Mental Status: She is alert.     Cranial Nerves: No cranial nerve deficit.  Skin:    General: Skin is warm and dry.  Psychiatric:        Mood and Affect: Mood normal.        Behavior: Behavior normal.        Thought Content: Thought content normal.        Judgment: Judgment normal.  Vitals and nursing note reviewed.    Assessment/Plan:  Encounter for routine checking of intrauterine contraceptive device (IUD)--discussed 8 yr indication for Mirena but pt is amenorrheic, has low chance of conception (and declines other BC), and I question rationale of replacing IUD for menorrhagia when currently amenorrheic. Pt is s/p ICH recently. Discussed re-eval bleeding/IUD next yr and see where we are and pt amenable to this. IUD in uterus is not harmful.  F/u sooner prn bleeding.  Pap due next yr.   Return in about 1 year (around 09/24/2023) for annual, IUD f/u.   Shelley Matherly B. Tieasha Larsen, PA-C 09/23/2022 5:17 PM

## 2022-09-23 ENCOUNTER — Ambulatory Visit (INDEPENDENT_AMBULATORY_CARE_PROVIDER_SITE_OTHER): Payer: Managed Care, Other (non HMO) | Admitting: Obstetrics and Gynecology

## 2022-09-23 ENCOUNTER — Encounter: Payer: Self-pay | Admitting: Obstetrics and Gynecology

## 2022-09-23 VITALS — BP 110/60 | Ht 63.0 in | Wt 163.0 lb

## 2022-09-23 DIAGNOSIS — Z30431 Encounter for routine checking of intrauterine contraceptive device: Secondary | ICD-10-CM | POA: Diagnosis not present

## 2022-10-03 ENCOUNTER — Encounter: Payer: Managed Care, Other (non HMO) | Admitting: Occupational Therapy

## 2022-12-23 ENCOUNTER — Telehealth: Payer: Self-pay | Admitting: Neurology

## 2022-12-23 NOTE — Telephone Encounter (Signed)
LVM and sent mychart msg informing pt of need to reschedule 01/28/23 appointment - MD out 

## 2023-01-28 ENCOUNTER — Ambulatory Visit: Payer: Managed Care, Other (non HMO) | Admitting: Neurology

## 2023-11-07 ENCOUNTER — Other Ambulatory Visit: Payer: Self-pay | Admitting: Neurology

## 2023-11-07 DIAGNOSIS — H5462 Unqualified visual loss, left eye, normal vision right eye: Secondary | ICD-10-CM

## 2023-11-07 DIAGNOSIS — R42 Dizziness and giddiness: Secondary | ICD-10-CM

## 2023-11-07 DIAGNOSIS — I629 Nontraumatic intracranial hemorrhage, unspecified: Secondary | ICD-10-CM

## 2023-11-22 NOTE — Progress Notes (Unsigned)
 PCP: Barbette Reichmann, MD   No chief complaint on file.   HPI:      [name] is a 53 y.o. [phone] whose LMP was No LMP recorded. (Menstrual status: IUD)., presents today for her annual examination.  Her menses are {norm/abn:715}, lasting {number: 22536} days.  Dysmenorrhea {dysmen:716}. She {does:18564} have intermenstrual bleeding. Amenorrheic with IUD  SEE LAS NOTE She {does:18564} have vasomotor sx.   Sex activity: {sex active: 315163}. She {does:18564} have vaginal dryness. Mirena IUD placed 9 yrs ago  Last Pap: 07/19/19 Results were: no abnormalities /neg HPV DNA.  Hx of STDs: {STD hx:14358}  Last mammogram: 12/12/21 Results were: normal--routine follow-up in 12 months There is no FH of breast cancer. There is no FH of ovarian cancer. The patient {does:18564} do self-breast exams.  Colonoscopy: {hx:15363}  Repeat due after 10*** years.   Tobacco use: {tob:20664} Alcohol use: {Alcohol:11675} No drug use Exercise: {exercise:31265}  She {does:18564} get adequate calcium and Vitamin D in her diet.  Labs with PCP.   Patient Active Problem List   Diagnosis Date Noted   Elevated hemoglobin A1c 07/03/2022   ICH (intracerebral hemorrhage) ([facility]) 06/11/2022   Anemia 01/12/2020   Other fatigue 01/12/2020    Past Surgical History:  Procedure Laterality Date   IR ANGIO INTRA EXTRACRAN SEL INTERNAL CAROTID BILAT MOD SED  06/12/2022   IR ANGIO VERTEBRAL SEL VERTEBRAL BILAT MOD SED  06/12/2022    Family History  Problem Relation Age of Onset   Breast cancer Maternal Grandmother 46    Social History   Socioeconomic History   Marital status: Married    Spouse name: Not on file   Number of children: Not on file   Years of education: Not on file   Highest education level: Not on file  Occupational History   Not on file  Tobacco Use   Smoking status: Never   Smokeless tobacco: Never  Vaping Use   Vaping status: Never Used  Substance and Sexual  Activity   Alcohol use: Never   Drug use: Never   Sexual activity: Yes    Partners: Male    Birth control/protection: I.U.D.    Comment: Mirena  Other Topics Concern   Not on file  Social History Narrative   Not on file   Social Drivers of Health   Financial Resource Strain: Low Risk  (02/10/2023)   Received from [facility], [facility]   Overall Financial Resource Strain (CARDIA)    Difficulty of Paying Living Expenses: Not hard at all  Food Insecurity: No Food Insecurity (02/10/2023)   Received from [facility], [facility]   Hunger Vital Sign    Worried About Running Out of Food in the Last Year: Never true    Ran Out of Food in the Last Year: Never true  Transportation Needs: No Transportation Needs (02/10/2023)   Received from [facility], [facility]   [facility] [place] - Transportation    In the past 12 months, has lack of transportation kept you from medical appointments or from getting medications?: No    Lack of Transportation (Non-Medical): No  Physical Activity: Not on file  Stress: Not on file  Social Connections: Not on file  Intimate Partner Violence: Not on file     Current Outpatient Medications:    levonorgestrel (MIRENA) 20 MCG/24HR IUD, 1 each by Intrauterine route once., Disp: , Rfl:    Vitamin D,  Ergocalciferol, (DRISDOL) 1.25 MG (50000 UNIT) CAPS capsule, Take 50,000 Units by mouth once a week. Sundays, Disp: , Rfl:      ROS:  Review of Systems BREAST: No symptoms    Objective: There were no vitals taken for this visit.   OBGyn Exam  Results: No results found for this or any previous visit (from the past 24 hours).  Assessment/Plan:  No diagnosis found.   No orders of the defined types were placed in this encounter.           GYN counsel {counseling: 16159}    F/U  No follow-ups on file.  Markayla Reichart B. Michi Herrmann, PA-C 11/22/2023 1:56  PM

## 2023-11-25 ENCOUNTER — Ambulatory Visit (INDEPENDENT_AMBULATORY_CARE_PROVIDER_SITE_OTHER): Admitting: Obstetrics and Gynecology

## 2023-11-25 ENCOUNTER — Encounter: Payer: Self-pay | Admitting: Obstetrics and Gynecology

## 2023-11-25 VITALS — BP 108/72 | Ht 63.0 in | Wt 164.0 lb

## 2023-11-25 DIAGNOSIS — Z780 Asymptomatic menopausal state: Secondary | ICD-10-CM

## 2023-11-25 DIAGNOSIS — Z30431 Encounter for routine checking of intrauterine contraceptive device: Secondary | ICD-10-CM

## 2023-11-25 DIAGNOSIS — Z01419 Encounter for gynecological examination (general) (routine) without abnormal findings: Secondary | ICD-10-CM

## 2023-11-25 DIAGNOSIS — Z1151 Encounter for screening for human papillomavirus (HPV): Secondary | ICD-10-CM

## 2023-11-25 DIAGNOSIS — Z124 Encounter for screening for malignant neoplasm of cervix: Secondary | ICD-10-CM

## 2023-11-25 DIAGNOSIS — Z1211 Encounter for screening for malignant neoplasm of colon: Secondary | ICD-10-CM

## 2023-11-25 DIAGNOSIS — Z1231 Encounter for screening mammogram for malignant neoplasm of breast: Secondary | ICD-10-CM

## 2023-11-25 NOTE — Patient Instructions (Signed)
 I value your feedback and you entrusting [place] with your care. If you get a [facility] patient survey, I would appreciate you taking the time to let us know about your experience today. Thank you!  [facility] ([place])--[phone]

## 2023-11-26 LAB — ESTRADIOL: Estradiol: <5 pg/mL

## 2023-11-26 LAB — FOLLICLE STIMULATING HORMONE: FSH: 63.3 m[IU]/mL

## 2023-11-27 ENCOUNTER — Encounter: Payer: Self-pay | Admitting: Obstetrics and Gynecology

## 2023-12-03 ENCOUNTER — Encounter: Payer: Self-pay | Admitting: Neurology

## 2023-12-03 LAB — IGP, APTIMA HPV: HPV Aptima: NEGATIVE

## 2023-12-16 ENCOUNTER — Encounter: Payer: Self-pay | Admitting: Neurology

## 2023-12-16 ENCOUNTER — Ambulatory Visit
Admission: RE | Admit: 2023-12-16 | Discharge: 2023-12-16 | Disposition: A | Discharge status: Home / Self Care | Source: Ambulatory Visit | Attending: Neurology | Admitting: Neurology

## 2023-12-16 ENCOUNTER — Other Ambulatory Visit: Payer: Self-pay | Admitting: Neurology

## 2023-12-16 DIAGNOSIS — R42 Dizziness and giddiness: Secondary | ICD-10-CM

## 2023-12-16 DIAGNOSIS — I629 Nontraumatic intracranial hemorrhage, unspecified: Secondary | ICD-10-CM

## 2023-12-16 DIAGNOSIS — H5462 Unqualified visual loss, left eye, normal vision right eye: Secondary | ICD-10-CM

## 2023-12-17 ENCOUNTER — Ambulatory Visit
Admission: RE | Admit: 2023-12-17 | Discharge: 2023-12-17 | Disposition: A | Discharge status: Home / Self Care | Source: Ambulatory Visit | Attending: Neurology | Admitting: Neurology

## 2023-12-17 DIAGNOSIS — H5462 Unqualified visual loss, left eye, normal vision right eye: Secondary | ICD-10-CM

## 2023-12-17 DIAGNOSIS — I629 Nontraumatic intracranial hemorrhage, unspecified: Secondary | ICD-10-CM

## 2023-12-17 DIAGNOSIS — R42 Dizziness and giddiness: Secondary | ICD-10-CM

## 2024-02-04 LAB — COLOGUARD: COLOGUARD: NEGATIVE

## 2024-02-05 ENCOUNTER — Ambulatory Visit: Payer: Self-pay | Admitting: Obstetrics and Gynecology
